# Patient Record
Sex: Male | Born: 1940 | Race: White | Hispanic: No | Smoking: Former smoker
Health system: Southern US, Community
[De-identification: ages and names within clinical notes are randomized; demographics above are authoritative.]

## PROBLEM LIST (undated history)

## (undated) DIAGNOSIS — K5790 Diverticulosis of intestine, part unspecified, without perforation or abscess without bleeding: Secondary | ICD-10-CM

## (undated) DIAGNOSIS — K279 Peptic ulcer, site unspecified, unspecified as acute or chronic, without hemorrhage or perforation: Secondary | ICD-10-CM

## (undated) DIAGNOSIS — F32A Depression, unspecified: Secondary | ICD-10-CM

## (undated) DIAGNOSIS — F329 Major depressive disorder, single episode, unspecified: Secondary | ICD-10-CM

## (undated) DIAGNOSIS — I10 Essential (primary) hypertension: Secondary | ICD-10-CM

## (undated) HISTORY — DX: Peptic ulcer, site unspecified, unspecified as acute or chronic, without hemorrhage or perforation: K27.9

## (undated) HISTORY — DX: Depression, unspecified: F32.A

## (undated) HISTORY — DX: Major depressive disorder, single episode, unspecified: F32.9

## (undated) HISTORY — DX: Diverticulosis of intestine, part unspecified, without perforation or abscess without bleeding: K57.90

## (undated) HISTORY — PX: CATARACT EXTRACTION W/ INTRAOCULAR LENS IMPLANT: SHX1309

## (undated) HISTORY — DX: Essential (primary) hypertension: I10

---
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
## 2004-08-24 HISTORY — PX: CARDIAC CATHETERIZATION: SHX172

## 2004-08-24 HISTORY — PX: CORONARY ANGIOPLASTY WITH STENT PLACEMENT: SHX49

## 2004-10-12 ENCOUNTER — Other Ambulatory Visit: Payer: Self-pay

## 2004-10-12 ENCOUNTER — Emergency Department: Payer: Self-pay | Admitting: Emergency Medicine

## 2004-11-06 ENCOUNTER — Observation Stay: Payer: Self-pay | Admitting: Internal Medicine

## 2005-03-08 IMAGING — CT CT ABD-PELV W/ CM
1 of 3 series · 14 of 32 positions shown, 18 images · non-contrast
Comparison: none

REASON FOR EXAM: (1) abd pain  IV & ORAL CONTRAST; (2) abd pain
COMMENTS:

[Series 3: inspace · axial · 0.80mm/px · z∈[-1556,-1149]mm · 14 of 458 slices shown, 18 images]
[im 34/458  soft-tissue]
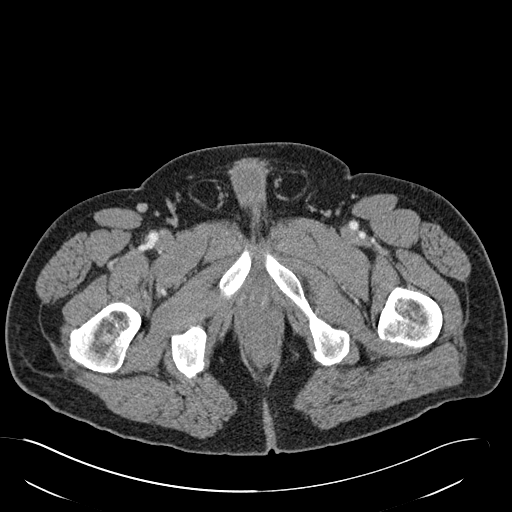
[im 34/458  bone]
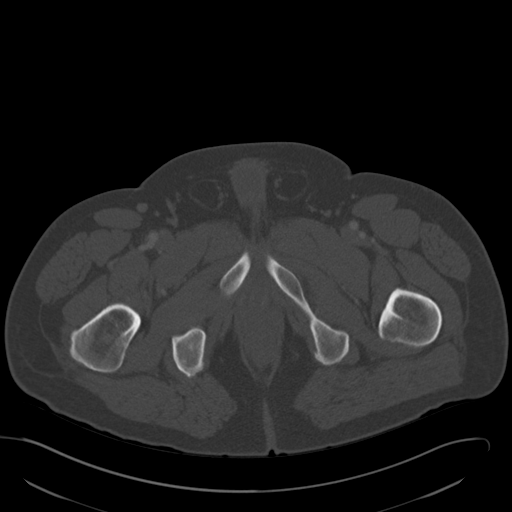
[im 68/458  soft-tissue]
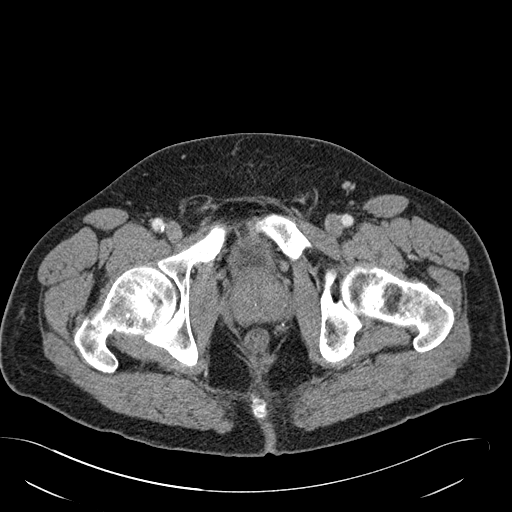
[im 102/458  soft-tissue]
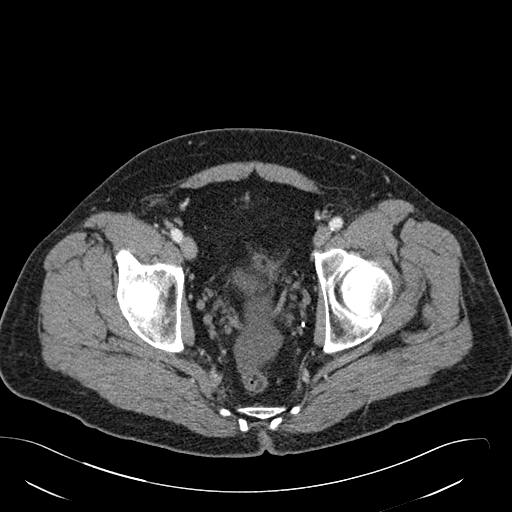
[im 136/458  soft-tissue]
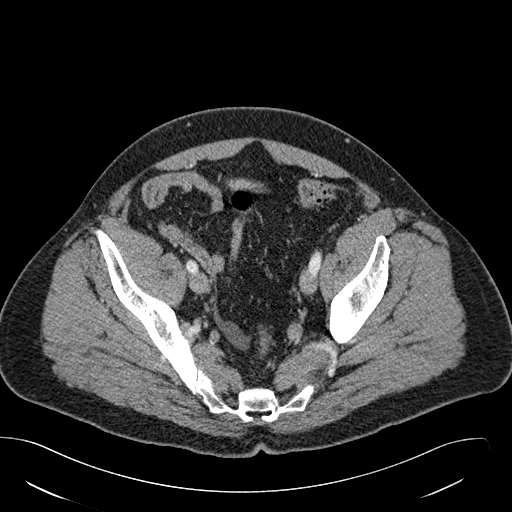
[im 170/458  soft-tissue]
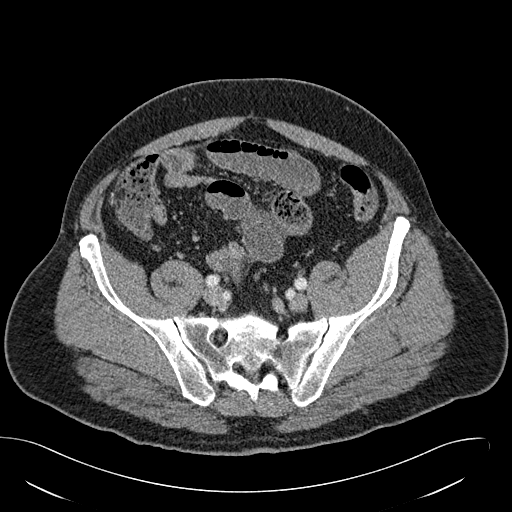
[im 204/458  soft-tissue]
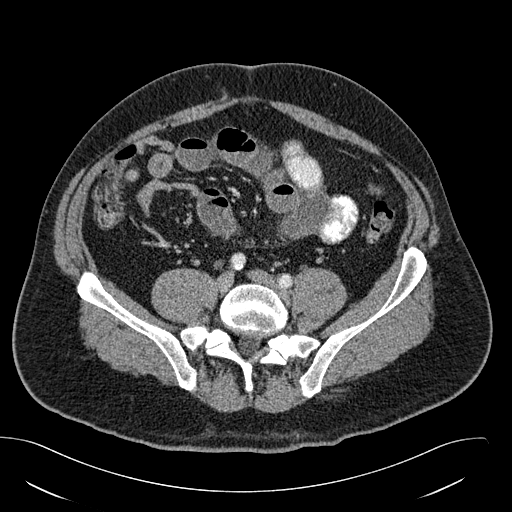
[im 254/458  soft-tissue]
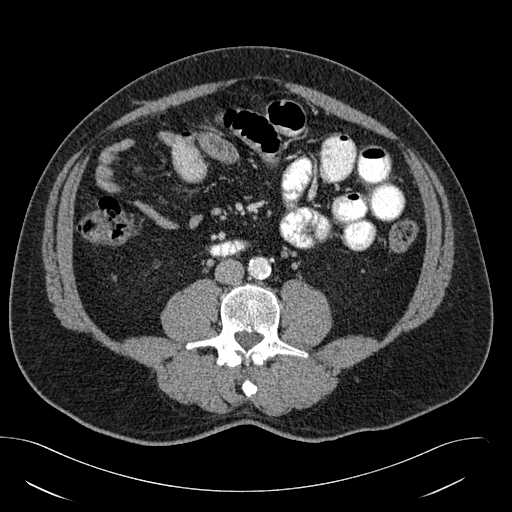
[im 288/458  soft-tissue]
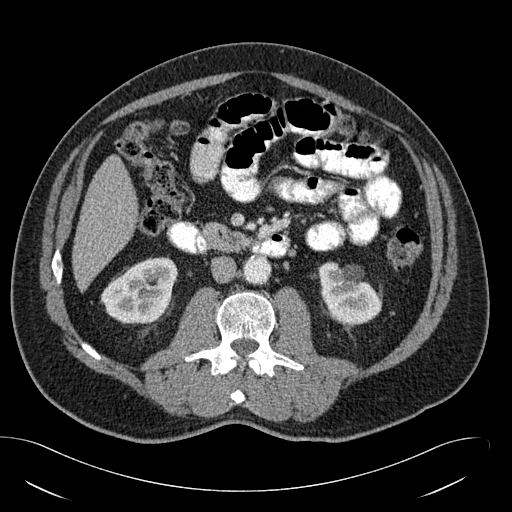
[im 322/458  soft-tissue]
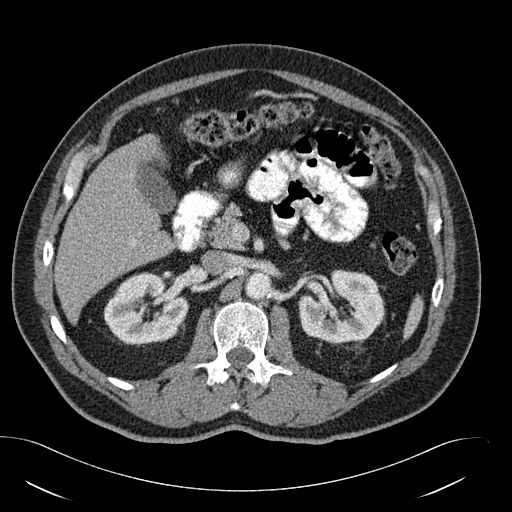
[im 322/458  bone]
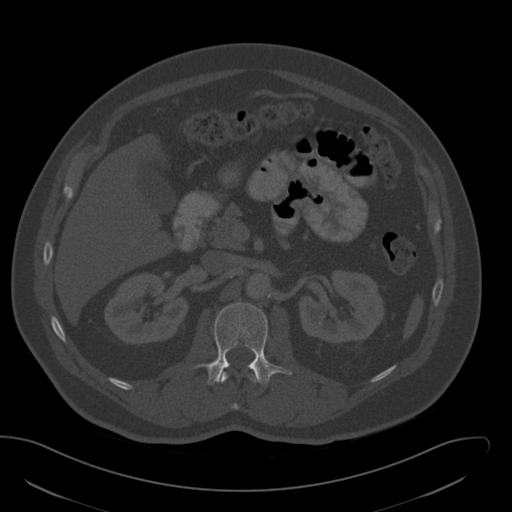
[im 356/458  soft-tissue]
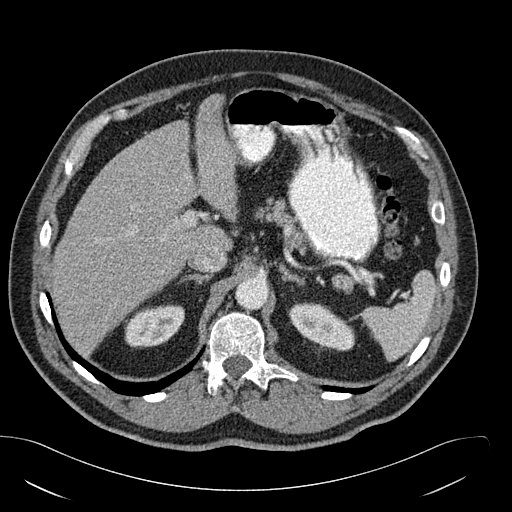
[im 390/458  soft-tissue]
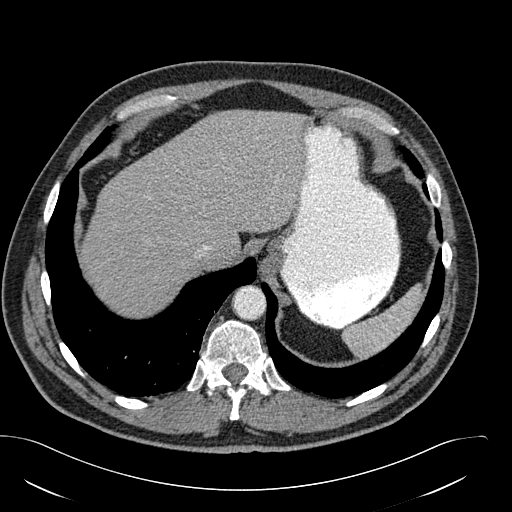
[im 390/458  lung]
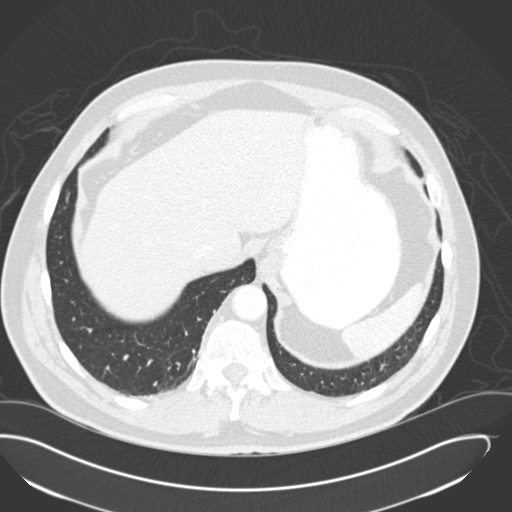
[im 407/458  lung]
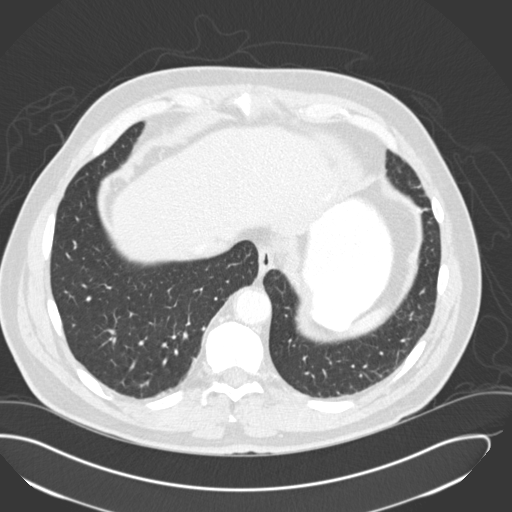
[im 424/458  soft-tissue]
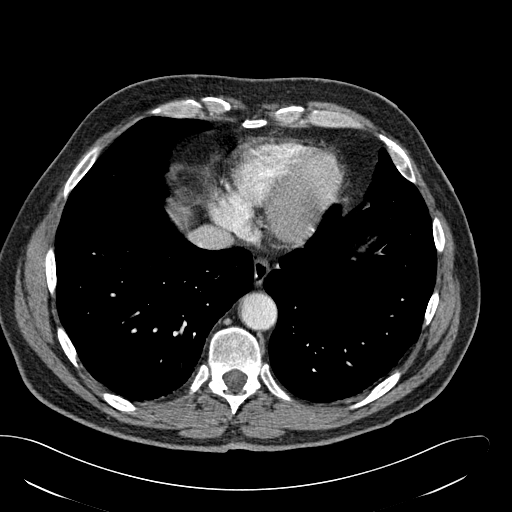
[im 424/458  lung]
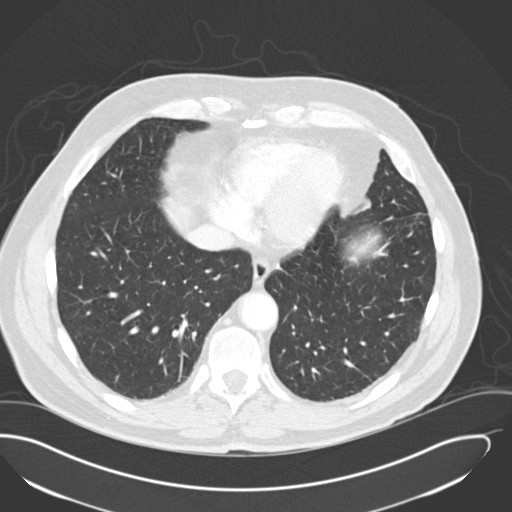
[im 441/458  lung]
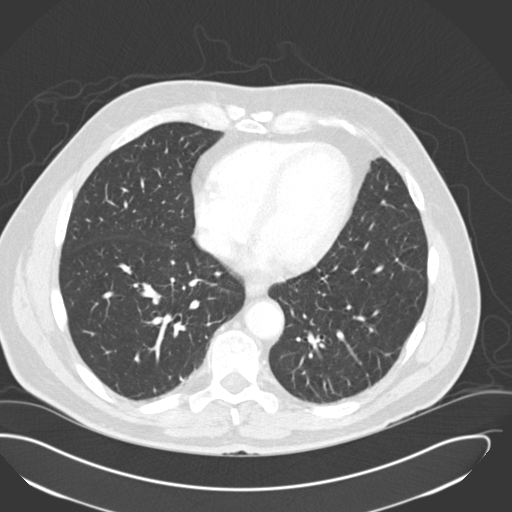

[14 of 32 positions shown; findings below may reference images not displayed]

PROCEDURE:     CT  - CT ABDOMEN / PELVIS  W  - October 12, 2004  [DATE]

RESULT:     The patient is complaining of abdominal discomfort.  The patient
received 100 cc of Isovue 370 as well as oral contrast.

The liver exhibits normal density with no evidence of a mass nor of ductal
dilation.  The gallbladder is adequately distended with no evidence of stone
or wall thickening nor pericholecystic fluid.  The spleen, pancreas and
adrenal glands are normal in appearance.  The stomach is moderately
distended with contrast material and other fluid.  The periaortic and
pericaval regions exhibit no evidence of lymphadenopathy.  There is mild
failure to taper of the abdominal aorta but there is no evidence of an
aneurysm.  The kidneys enhance well.  There is a hypodensity associated with
the lower pole of the LEFT kidney.  Hounsfield measurement is 1 and this is
most compatible with a benign cyst.  It measures approximately 1.2 x 1.7 cm
in diameter.

The loops of partially contrast-filled small bowel are grossly normal.
However, contrast material has not reached the distal small bowel nor the
colon.  The transverse colon contains stool and gas and is grossly normal.
There is a dense calcification in the upper aspect of the pelvis that
appears to be associated with the cecum or appendix.  I do not see
significant surrounding inflammation, however.  This could reflect an
appendicolith though certainly a very dense medication tablet could produce
similar findings.  The descending colon and sigmoid colon exhibit no
evidence of acute inflammation.  A few scattered diverticula are seen.  The
urinary bladder is partially distended and grossly normal.  The prostate
gland produces a moderate impression upon the urinary bladder base.  There
is a small amount of free fluid in the pelvis.  I see no pelvic
lymphadenopathy.

The lung bases are clear.
IMPRESSION: 1.     I do not see objective evidence of an intra-abdominal abscess nor
evidence of perforation or obstruction.  However, there is a small amount of
free fluid in the pelvis and there is a radiodensity in the RIGHT lower
quadrant of the abdomen that is suggestive of a fecalith or conceivably an
appendicolith at the junction of the appendix with the RIGHT colon.  I do
not see significant surrounding edema nor evidence of free fluid in this
region.  Correlation with the patient's clinical and laboratory exam with
attention to this area of the abdomen will be needed.
2.     The appearance of the loops of small bowel is grossly normal though
the contrast has not yet reached the distal small bowel.  Very minimal
prominence of the caliber of the small bowel loops is seen but the
appearance is not consistent with an obstruction.
3.     I see no evidence of hepatobiliary disease nor acute renal
abnormality.
4.     The findings were called to the Emergency Room at the conclusion of
the study.

## 2005-04-02 IMAGING — CT CT HEAD WITHOUT CONTRAST
2 series · 16 of 30 positions shown, 20 images · non-contrast
Comparison: none

REASON FOR EXAM: syncope    rm 18
COMMENTS:

[Series 2: without · axial · non-contrast · 0.41mm/px · z∈[-142,-22]mm · 13 of 30 slices shown, 17 images]
[im 3/30  brain]
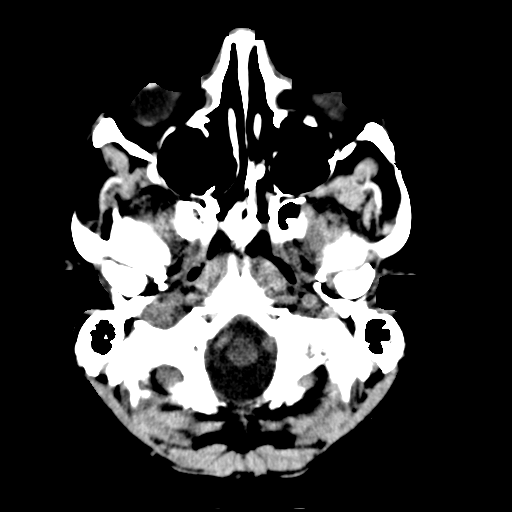
[im 3/30  bone]
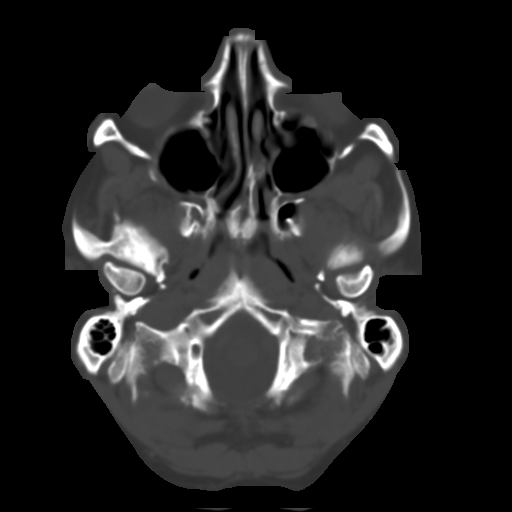
[im 5/30  brain]
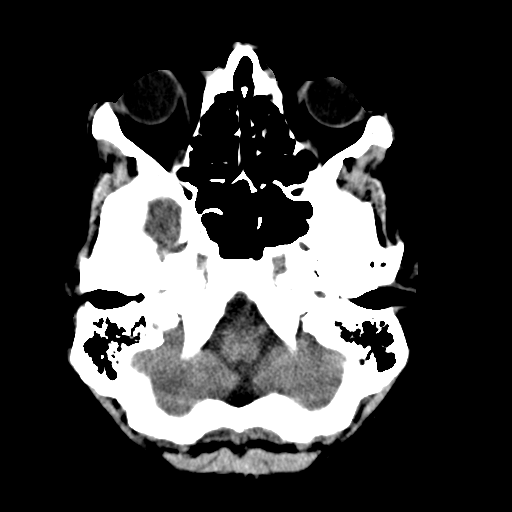
[im 7/30  brain]
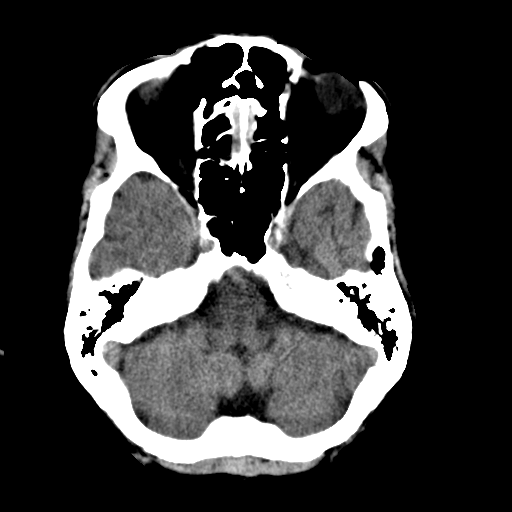
[im 9/30  brain]
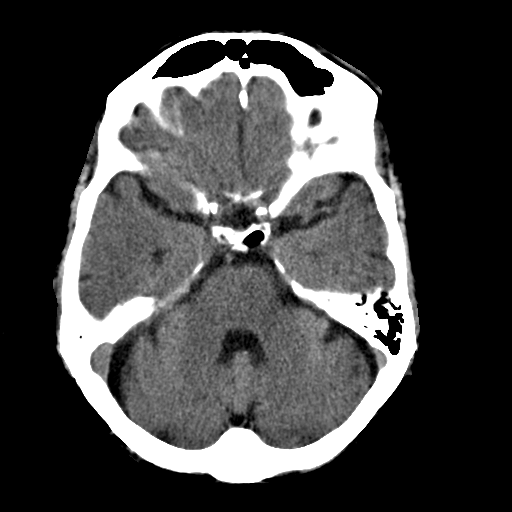
[im 11/30  brain]
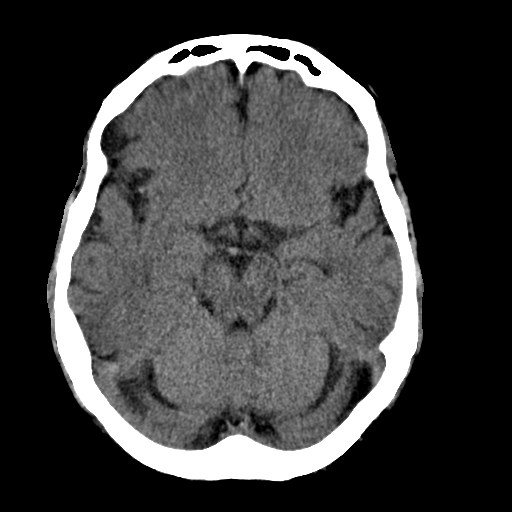
[im 11/30  bone]
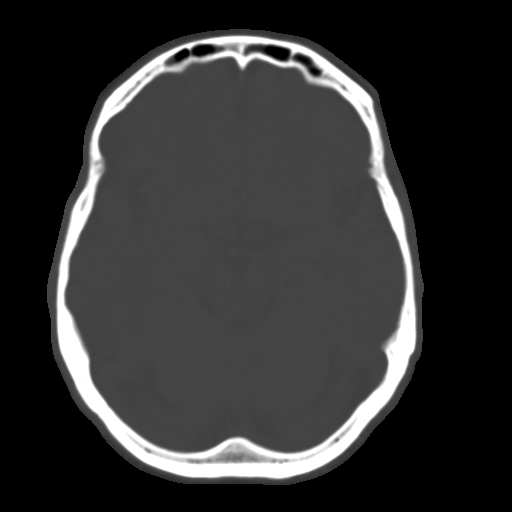
[im 13/30  brain]
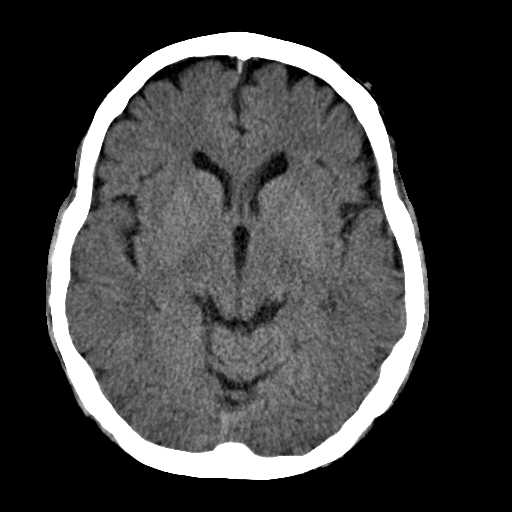
[im 15/30  brain]
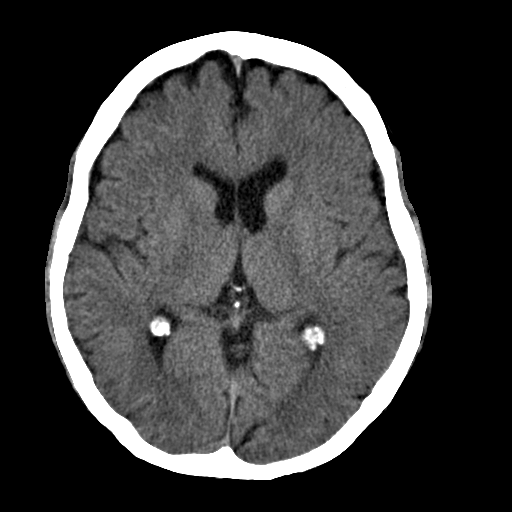
[im 17/30  brain]
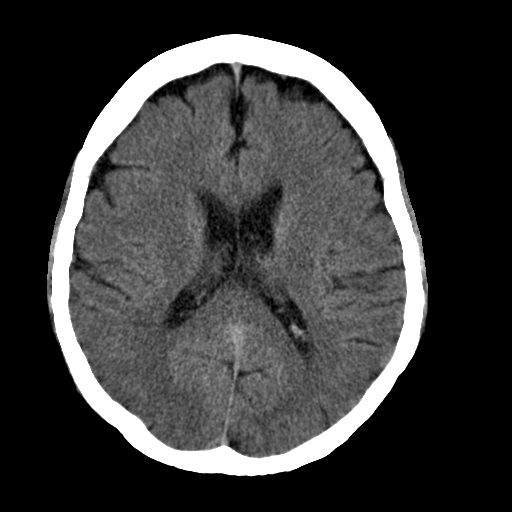
[im 19/30  brain]
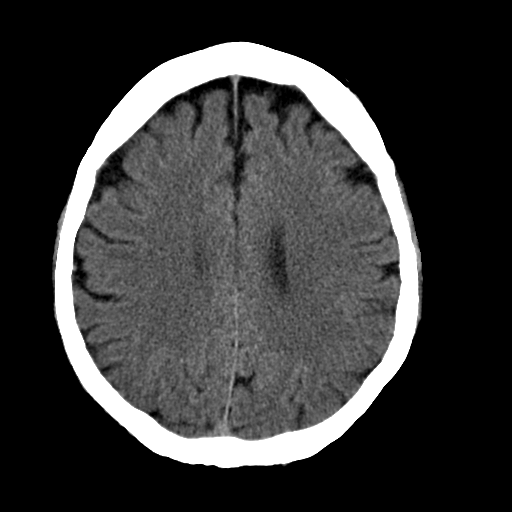
[im 19/30  bone]
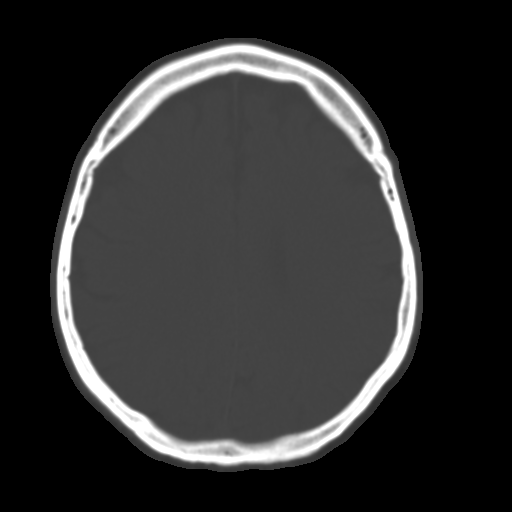
[im 21/30  brain]
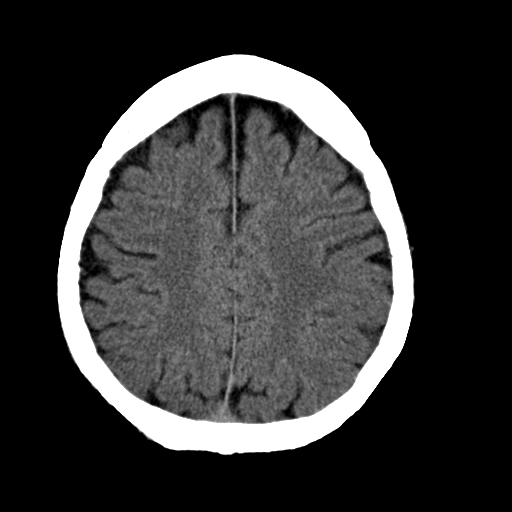
[im 23/30  brain]
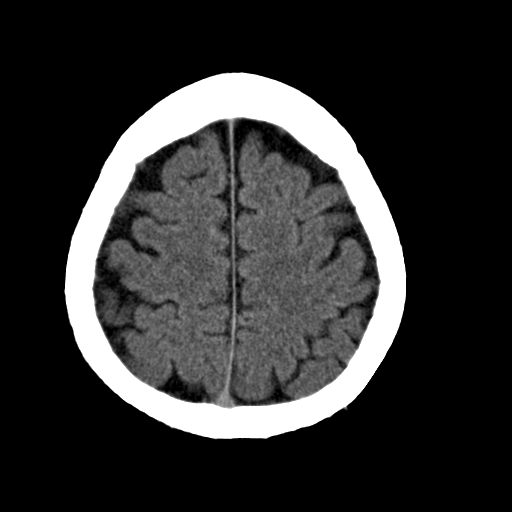
[im 25/30  brain]
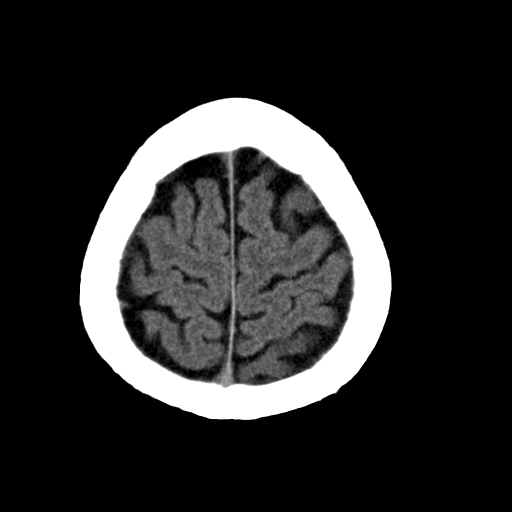
[im 27/30  brain]
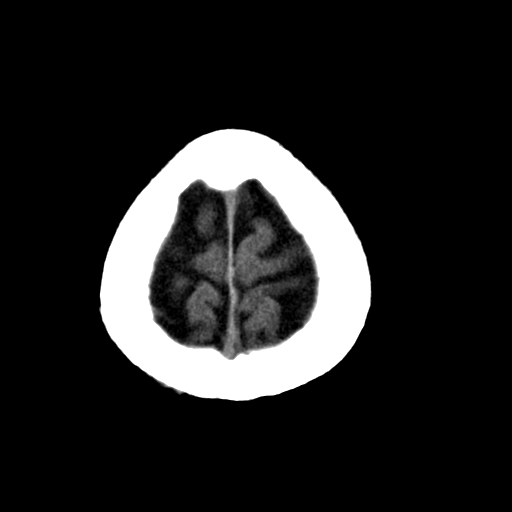
[im 27/30  bone]
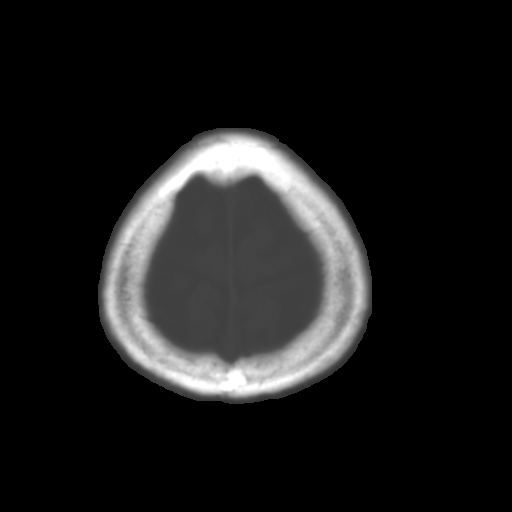

[Series 3: bone windows · axial · 0.41mm/px · z∈[-142,-102]mm · 3 of 30 slices shown]
[im 3/30  bone]
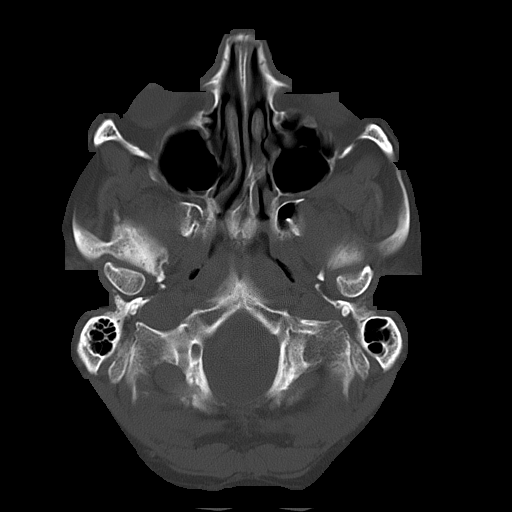
[im 7/30  bone]
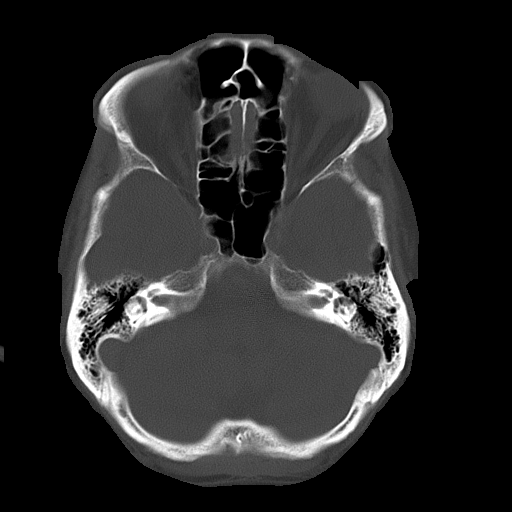
[im 11/30  bone]
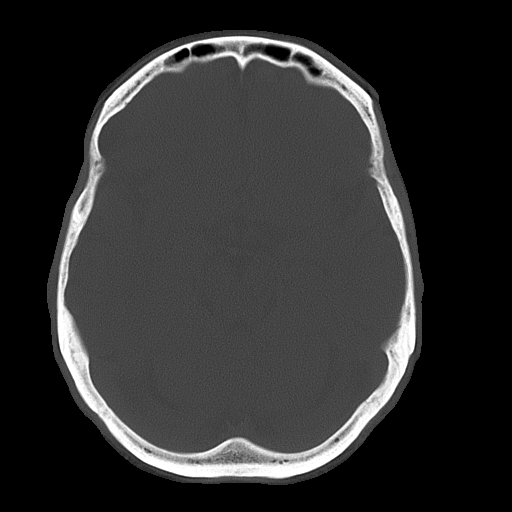

[16 of 30 positions shown; findings below may reference images not displayed]

PROCEDURE:     CT  - CT HEAD WITHOUT CONTRAST  - November 06, 2004  [DATE]

RESULT:     Emergent unenhanced Head CT was performed for syncope. The exam
was originally read by [HOSPITAL] as negative for atrophy.

There are noted mild age appropriate atrophy. No mass effect is seen. No
shift to the midline. No intracerebral bleeds are noted. No extra-axial
fluid collections are identified.  On the bone window settings no definite
acute fractures are seen.
IMPRESSION: 1)Mild age appropriate atrophy, otherwise, no significant abnormalities are
identified.

## 2009-10-16 ENCOUNTER — Ambulatory Visit: Payer: Self-pay | Admitting: Ophthalmology

## 2009-10-28 ENCOUNTER — Ambulatory Visit: Payer: Self-pay | Admitting: Ophthalmology

## 2012-05-25 DIAGNOSIS — H2510 Age-related nuclear cataract, unspecified eye: Secondary | ICD-10-CM | POA: Diagnosis not present

## 2012-07-21 DIAGNOSIS — N400 Enlarged prostate without lower urinary tract symptoms: Secondary | ICD-10-CM | POA: Diagnosis not present

## 2012-07-21 DIAGNOSIS — N433 Hydrocele, unspecified: Secondary | ICD-10-CM | POA: Diagnosis not present

## 2012-07-21 DIAGNOSIS — Z886 Allergy status to analgesic agent status: Secondary | ICD-10-CM | POA: Diagnosis not present

## 2012-07-21 DIAGNOSIS — K56609 Unspecified intestinal obstruction, unspecified as to partial versus complete obstruction: Secondary | ICD-10-CM | POA: Diagnosis not present

## 2012-07-21 DIAGNOSIS — K5730 Diverticulosis of large intestine without perforation or abscess without bleeding: Secondary | ICD-10-CM | POA: Diagnosis not present

## 2012-07-21 DIAGNOSIS — R110 Nausea: Secondary | ICD-10-CM | POA: Diagnosis not present

## 2012-07-21 DIAGNOSIS — Z9861 Coronary angioplasty status: Secondary | ICD-10-CM | POA: Diagnosis not present

## 2012-07-21 DIAGNOSIS — R1084 Generalized abdominal pain: Secondary | ICD-10-CM | POA: Diagnosis not present

## 2012-07-21 DIAGNOSIS — K5289 Other specified noninfective gastroenteritis and colitis: Secondary | ICD-10-CM | POA: Diagnosis not present

## 2012-07-21 DIAGNOSIS — R109 Unspecified abdominal pain: Secondary | ICD-10-CM | POA: Diagnosis not present

## 2012-08-24 HISTORY — PX: HYDROCELE EXCISION / REPAIR: SUR1145

## 2012-12-01 ENCOUNTER — Ambulatory Visit: Payer: Self-pay | Admitting: Unknown Physician Specialty

## 2012-12-01 LAB — HM COLONOSCOPY

## 2013-08-04 DIAGNOSIS — R1084 Generalized abdominal pain: Secondary | ICD-10-CM | POA: Diagnosis not present

## 2013-08-05 ENCOUNTER — Emergency Department: Payer: Self-pay | Admitting: Emergency Medicine

## 2013-08-05 LAB — COMPREHENSIVE METABOLIC PANEL
Alkaline Phosphatase: 52 U/L
Anion Gap: 5 — ABNORMAL LOW (ref 7–16)
Bilirubin,Total: 0.5 mg/dL (ref 0.2–1.0)
Calcium, Total: 9.1 mg/dL (ref 8.5–10.1)
Chloride: 105 mmol/L (ref 98–107)
Creatinine: 1.32 mg/dL — ABNORMAL HIGH (ref 0.60–1.30)
EGFR (African American): >60
EGFR (Non-African Amer.): 54 — ABNORMAL LOW
Glucose: 119 mg/dL — ABNORMAL HIGH (ref 65–99)
Osmolality: 280 (ref 275–301)
Potassium: 4.1 mmol/L (ref 3.5–5.1)
SGOT(AST): 31 U/L (ref 15–37)
Total Protein: 7.1 g/dL (ref 6.4–8.2)

## 2013-08-05 LAB — CBC WITH DIFFERENTIAL/PLATELET
Basophil #: 0.1 10*3/uL (ref 0.0–0.1)
Eosinophil #: 0.1 10*3/uL (ref 0.0–0.7)
HCT: 44.6 % (ref 40.0–52.0)
HGB: 14.5 g/dL (ref 13.0–18.0)
MCH: 29.2 pg (ref 26.0–34.0)
MCV: 90 fL (ref 80–100)
Monocyte #: 1.2 x10 3/mm — ABNORMAL HIGH (ref 0.2–1.0)
Monocyte %: 8.7 %
Neutrophil %: 68.2 %
RBC: 4.97 10*6/uL (ref 4.40–5.90)
WBC: 13.6 10*3/uL — ABNORMAL HIGH (ref 3.8–10.6)

## 2013-08-05 LAB — URINALYSIS, COMPLETE
Blood: NEGATIVE
Glucose,UR: NEGATIVE mg/dL (ref 0–75)
Nitrite: NEGATIVE
Squamous Epithelial: NONE SEEN
WBC UR: 3 /HPF (ref 0–5)

## 2013-08-05 LAB — LIPASE, BLOOD: Lipase: 65 U/L — ABNORMAL LOW (ref 73–393)

## 2013-08-29 DIAGNOSIS — K5730 Diverticulosis of large intestine without perforation or abscess without bleeding: Secondary | ICD-10-CM | POA: Diagnosis not present

## 2013-08-29 DIAGNOSIS — R198 Other specified symptoms and signs involving the digestive system and abdomen: Secondary | ICD-10-CM | POA: Diagnosis not present

## 2013-10-09 DIAGNOSIS — Z961 Presence of intraocular lens: Secondary | ICD-10-CM | POA: Diagnosis not present

## 2013-10-09 DIAGNOSIS — H2510 Age-related nuclear cataract, unspecified eye: Secondary | ICD-10-CM | POA: Diagnosis not present

## 2013-10-09 DIAGNOSIS — H26499 Other secondary cataract, unspecified eye: Secondary | ICD-10-CM | POA: Diagnosis not present

## 2013-10-09 DIAGNOSIS — H04129 Dry eye syndrome of unspecified lacrimal gland: Secondary | ICD-10-CM | POA: Diagnosis not present

## 2014-10-17 DIAGNOSIS — R55 Syncope and collapse: Secondary | ICD-10-CM | POA: Diagnosis not present

## 2014-10-17 DIAGNOSIS — I10 Essential (primary) hypertension: Secondary | ICD-10-CM | POA: Diagnosis not present

## 2014-10-17 DIAGNOSIS — Z87891 Personal history of nicotine dependence: Secondary | ICD-10-CM | POA: Diagnosis not present

## 2015-01-03 DIAGNOSIS — L853 Xerosis cutis: Secondary | ICD-10-CM | POA: Diagnosis not present

## 2015-01-03 DIAGNOSIS — Z789 Other specified health status: Secondary | ICD-10-CM | POA: Diagnosis not present

## 2015-01-03 DIAGNOSIS — L570 Actinic keratosis: Secondary | ICD-10-CM | POA: Diagnosis not present

## 2015-01-03 DIAGNOSIS — L820 Inflamed seborrheic keratosis: Secondary | ICD-10-CM | POA: Diagnosis not present

## 2015-01-03 DIAGNOSIS — L905 Scar conditions and fibrosis of skin: Secondary | ICD-10-CM | POA: Diagnosis not present

## 2015-01-03 DIAGNOSIS — L538 Other specified erythematous conditions: Secondary | ICD-10-CM | POA: Diagnosis not present

## 2015-01-03 DIAGNOSIS — L02821 Furuncle of head [any part, except face]: Secondary | ICD-10-CM | POA: Diagnosis not present

## 2015-05-06 ENCOUNTER — Ambulatory Visit: Payer: Self-pay

## 2015-07-16 DIAGNOSIS — E7800 Pure hypercholesterolemia, unspecified: Secondary | ICD-10-CM | POA: Diagnosis not present

## 2015-07-16 DIAGNOSIS — H2511 Age-related nuclear cataract, right eye: Secondary | ICD-10-CM | POA: Diagnosis not present

## 2015-07-16 DIAGNOSIS — T8522XA Displacement of intraocular lens, initial encounter: Secondary | ICD-10-CM | POA: Diagnosis not present

## 2015-07-16 DIAGNOSIS — I10 Essential (primary) hypertension: Secondary | ICD-10-CM | POA: Diagnosis not present

## 2015-07-17 DIAGNOSIS — H2589 Other age-related cataract: Secondary | ICD-10-CM | POA: Diagnosis not present

## 2016-07-13 DIAGNOSIS — K5790 Diverticulosis of intestine, part unspecified, without perforation or abscess without bleeding: Secondary | ICD-10-CM | POA: Insufficient documentation

## 2016-07-13 DIAGNOSIS — F329 Major depressive disorder, single episode, unspecified: Secondary | ICD-10-CM | POA: Insufficient documentation

## 2016-07-13 DIAGNOSIS — R194 Change in bowel habit: Secondary | ICD-10-CM | POA: Insufficient documentation

## 2016-07-13 DIAGNOSIS — K279 Peptic ulcer, site unspecified, unspecified as acute or chronic, without hemorrhage or perforation: Secondary | ICD-10-CM | POA: Insufficient documentation

## 2016-07-13 DIAGNOSIS — N529 Male erectile dysfunction, unspecified: Secondary | ICD-10-CM | POA: Insufficient documentation

## 2016-07-13 DIAGNOSIS — I10 Essential (primary) hypertension: Secondary | ICD-10-CM | POA: Insufficient documentation

## 2016-07-13 DIAGNOSIS — F32A Depression, unspecified: Secondary | ICD-10-CM | POA: Insufficient documentation

## 2016-07-14 ENCOUNTER — Ambulatory Visit (INDEPENDENT_AMBULATORY_CARE_PROVIDER_SITE_OTHER): Payer: Medicare Other | Admitting: Family Medicine

## 2016-07-14 ENCOUNTER — Encounter: Payer: Self-pay | Admitting: Family Medicine

## 2016-07-14 VITALS — BP 92/60 | HR 73 | Temp 97.7°F | Resp 16 | Wt 226.0 lb

## 2016-07-14 DIAGNOSIS — N529 Male erectile dysfunction, unspecified: Secondary | ICD-10-CM

## 2016-07-14 DIAGNOSIS — K5790 Diverticulosis of intestine, part unspecified, without perforation or abscess without bleeding: Secondary | ICD-10-CM | POA: Diagnosis not present

## 2016-07-14 DIAGNOSIS — Z23 Encounter for immunization: Secondary | ICD-10-CM

## 2016-07-14 MED ORDER — TADALAFIL 20 MG PO TABS: 20.0000 mg | ORAL_TABLET | Freq: Every day | ORAL | 1 refills | Status: DC | PRN

## 2016-07-14 MED ORDER — CIPROFLOXACIN HCL 500 MG PO TABS: 500.0000 mg | ORAL_TABLET | Freq: Two times a day (BID) | ORAL | 0 refills | Status: AC

## 2016-07-14 MED ORDER — METRONIDAZOLE 500 MG PO TABS: 500.0000 mg | ORAL_TABLET | Freq: Two times a day (BID) | ORAL | 0 refills | Status: DC

## 2016-07-14 MED ORDER — HYDROCODONE-ACETAMINOPHEN 7.5-325 MG PO TABS: 1.0000 | ORAL_TABLET | Freq: Four times a day (QID) | ORAL | 0 refills | Status: DC | PRN

## 2016-07-14 NOTE — Progress Notes (Signed)
Patient: Patrick AbleJohn W Nie Male    DOB: 22-Jan-1941   75 y.o.   MRN: [id number] Visit Date: 07/14/2016  Today's Provider: Mila Merryonald Raha Tennison, MD   Chief Complaint  Patient presents with  . Follow-up    ED   Subjective:    HPI Follow up Erectile Dysfunction: He had previously been followed by Dr. Garner Nashaniels and tried MUSE device which did not work well. He did not tolerate Viagra due to headaches and visual disturbances. He did fairly well with 10mg  Cialis, but states it is no longer as effective as it used to be.   He also history of recurrent diverticulitis and has responded well to metronidazole, cipro and hydrocodone/apap in the past. He usually keeps a prescription of these on hand, but has now expired and would like to get new prescriptions.   He has history of hypertension and hyperlipidemia which is managed at the [facility].     Allergies  Allergen Reactions  . Advil [Ibuprofen]     Gel Caps     Current Outpatient Prescriptions:  .  aspirin 81 MG tablet, Take 1 tablet by mouth daily., Disp: , Rfl:  .  atorvastatin (LIPITOR) 40 MG tablet, Take 1 tablet by mouth daily., Disp: , Rfl:  .  lisinopril (PRINIVIL,ZESTRIL) 40 MG tablet, Take 0.5 tablets by mouth daily., Disp: , Rfl:  .  tadalafil (CIALIS) 5 MG tablet, Take 1 tablet by mouth daily as needed., Disp: , Rfl:   Review of Systems  Constitutional: Negative for appetite change, chills and fever.  Respiratory: Negative for chest tightness, shortness of breath and wheezing.   Cardiovascular: Negative for chest pain and palpitations.  Gastrointestinal: Negative for abdominal pain, nausea and vomiting.    Social History  Substance Use Topics  . Smoking status: Former Games developermoker  . Smokeless tobacco: Never Used  . Alcohol use No   Objective:   BP 92/60 (BP Location: Left Arm, Patient Position: Sitting, Cuff Size: Large)   Pulse 73   Temp 97.7 F (36.5 C) (Oral)   Resp 16   Wt 226 lb (102.5 kg)   SpO2 97% Comment: room  air  Physical Exam  General appearance: alert, well developed, well nourished, cooperative and in no distress Head: Normocephalic, without obvious abnormality, atraumatic Respiratory: Respirations even and unlabored, normal respiratory rate Extremities: No gross deformities Skin: Skin color, texture, turgor normal. No rashes seen  Psych: Appropriate mood and affect. Neurologic: Mental status: Alert, oriented to person, place, and time, thought content appropriate.     Assessment & Plan:     1. Erectile dysfunction, unspecified erectile dysfunction type Try higher dose of cialis - tadalafil (CIALIS) 20 MG tablet; Take 1 tablet (20 mg total) by mouth daily as needed.  Dispense: 5 tablet; Refill: 1  2. Need for influenza vaccination  - Flu vaccine HIGH DOSE PF (Fluzone High dose)  3. 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  Dispense: 20 tablet; Refill: 0 - metroNIDAZOLE (FLAGYL) 500 MG tablet; Take 1 tablet (500 mg total) by mouth 2 (two) times daily.  Dispense: 20 tablet; Refill: 0 - HYDROcodone-acetaminophen (NORCO) 7.5-325 MG tablet; Take 1 tablet by mouth every 6 (six) hours as needed for moderate pain.  Dispense: 30 tablet; Refill: 0       Mila Merryonald Archana Eckman,  MD  [place]

## 2016-09-17 DIAGNOSIS — T8522XS Displacement of intraocular lens, sequela: Secondary | ICD-10-CM | POA: Diagnosis not present

## 2016-09-17 DIAGNOSIS — H268 Other specified cataract: Secondary | ICD-10-CM | POA: Diagnosis not present

## 2016-11-12 ENCOUNTER — Telehealth: Payer: Self-pay | Admitting: Family Medicine

## 2016-11-12 NOTE — Telephone Encounter (Signed)
Called Pt to schedule AWV with [facility] - knb °

## 2016-11-24 ENCOUNTER — Ambulatory Visit: Payer: Medicare Other

## 2016-12-01 ENCOUNTER — Ambulatory Visit (INDEPENDENT_AMBULATORY_CARE_PROVIDER_SITE_OTHER): Payer: Medicare Other

## 2016-12-01 VITALS — BP 108/70 | HR 68 | Temp 99.2°F | Ht 72.0 in | Wt 209.8 lb

## 2016-12-01 DIAGNOSIS — Z0000 Encounter for general adult medical examination without abnormal findings: Secondary | ICD-10-CM

## 2016-12-01 NOTE — Progress Notes (Signed)
Subjective:   Patrick Hardin is a 76 y.o. male who presents for an Initial Medicare Annual Wellness Visit.  Review of Systems  N/A  Cardiac Risk Factors include: advanced age (>69men, >48 women);dyslipidemia;hypertension;male gender    Objective:    Today's Vitals   12/01/16 1336  BP: 108/70  Pulse: 68  Temp: 99.2 F (37.3 C)  TempSrc: Oral  Weight: 209 lb 12.8 oz (95.2 kg)  Height: 6' (1.829 m)  PainSc: 0-No pain   Body mass index is 28.45 kg/m.  Current Medications (verified) Outpatient Encounter Prescriptions as of 12/01/2016  Medication Sig  . aspirin 81 MG tablet Take 1 tablet by mouth daily.  Marland Kitchen atorvastatin (LIPITOR) 40 MG tablet Take 1 tablet by mouth daily.  Marland Kitchen HYDROcodone-acetaminophen (NORCO) 7.5-325 MG tablet Take 1 tablet by mouth every 6 (six) hours as needed for moderate pain.  Marland Kitchen lisinopril (PRINIVIL,ZESTRIL) 40 MG tablet Take 0.5 tablets by mouth daily.  . tadalafil (CIALIS) 20 MG tablet Take 1 tablet (20 mg total) by mouth daily as needed.  . metroNIDAZOLE (FLAGYL) 500 MG tablet Take 1 tablet (500 mg total) by mouth 2 (two) times daily. (Patient not taking: Reported on 12/01/2016)  . Multiple Vitamin (MULTI-VITAMINS) TABS Take by mouth.   No facility-administered encounter medications on file as of 12/01/2016.     Allergies (verified) Advil [ibuprofen] and Viagra [sildenafil citrate]   History: Past Medical History:  Diagnosis Date  . Depression   . Diverticulosis   . Hypertension   . PUD (peptic ulcer disease)    Past Surgical History:  Procedure Laterality Date  . CARDIAC CATHETERIZATION  2006   Stenting at [facility]  . CATARACT EXTRACTION W/ INTRAOCULAR LENS IMPLANT    . CORONARY ANGIOPLASTY WITH STENT PLACEMENT  2006  . HYDROCELE EXCISION / REPAIR  2014   repair. [facility]  . TONSILLECTOMY AND ADENOIDECTOMY  1950   Family History  Problem Relation Age of Onset  . Leukemia Father    Social History   Occupational History  . Not on file.    Social History Main Topics  . Smoking status: Former Smoker    Types: Cigarettes  . Smokeless tobacco: Never Used     Comment: quit in 1997  . Alcohol use No  . Drug use: No  . Sexual activity: Not on file   Tobacco Counseling Counseling given: Not Answered   Activities of Daily Living In your present state of health, do you have any difficulty performing the following activities: 12/01/2016 07/14/2016  Hearing? N N  Vision? N N  Difficulty concentrating or making decisions? N N  Walking or climbing stairs? N N  Dressing or bathing? N N  Doing errands, shopping? N N  Preparing Food and eating ? N -  Using the Toilet? N -  In the past six months, have you accidently leaked urine? N -  Do you have problems with loss of bowel control? N -  Managing your Medications? N -  Managing your Finances? N -  Housekeeping or managing your Housekeeping? N -  Some recent data might be hidden    Immunizations and Health Maintenance Immunization History  Administered Date(s) Administered  . Influenza, High Dose Seasonal PF 07/14/2016   There are no preventive care reminders to display for this patient.  Patient Care Team: Malva Limes, MD as PCP - General (Family Medicine) Synetta Fail, MD as Referring Physician (Ophthalmology) Provider Not In System as Consulting Physician  Indicate any recent  Medical Services you may have received from other than [facility] providers in the past year (date may be approximate).    Assessment:   This is a routine wellness examination for Patrick Hardin.   Hearing/Vision screen Vision Screening Comments: Pt has vision checks once yearly.   Dietary issues and exercise activities discussed: Current Exercise Habits: Structured exercise class, Type of exercise: walking;treadmill;stretching;strength training/weights, Time (Minutes): 45, Frequency (Times/Week): 2 (to 3 days), Weekly Exercise (Minutes/Week): 90, Intensity: Mild  Goals    . Increase water  intake          Recommend increasing water intake to at least 4 glasses a day.      Depression Screen PHQ 2/9 Scores 12/01/2016 07/14/2016  PHQ - 2 Score 2 0  PHQ- 9 Score 8 0    Fall Risk Fall Risk  12/01/2016 07/14/2016  Falls in the past year? No No    Cognitive Function:     6CIT Screen 12/01/2016  What Year? 0 points  What month? 0 points  What time? 0 points  Count back from 20 0 points  Months in reverse 2 points  Repeat phrase 0 points  Total Score 2    Screening Tests Health Maintenance  Topic Date Due  . TETANUS/TDAP  08/24/2026 (Originally 01/08/1960)  . PNA vac Low Risk Adult (1 of 2 - PCV13) 08/24/2026 (Originally 01/09/2007)  . INFLUENZA VACCINE  03/24/2017  . COLONOSCOPY  12/02/2022        Plan:  I have personally reviewed and addressed the Medicare Annual Wellness questionnaire and have noted the following in the patient's chart:  A. Medical and social history B. Use of alcohol, tobacco or illicit drugs  C. Current medications and supplements D. Functional ability and status E.  Nutritional status F.  Physical activity G. Advance directives H. List of other physicians I.  Hospitalizations, surgeries, and ER visits in previous 12 months J.  Vitals K. Screenings such as hearing and vision if needed, cognitive and depression L. Referrals and appointments - none  In addition, I have reviewed and discussed with patient certain preventive protocols, quality metrics, and best practice recommendations. A written personalized care plan for preventive services as well as general preventive health recommendations were provided to patient.  See attached scanned questionnaire for additional information.   Signed,  Hyacinth Meeker, LPN Nurse Health Advisor   MD Recommendations: None. Pt declined tetanus and pneumonia vaccines today. Pt states he has had both pneumonia shots at the [place]. Requested copy.   I have reviewed the health advisor's note, was  available for consultation, and agree with documentation and plan  Mila Merry, MD

## 2016-12-01 NOTE — Patient Instructions (Signed)
Patrick Hardin , Thank you for taking time to come for your Medicare Wellness Visit. I appreciate your ongoing commitment to your health goals. Please review the following plan we discussed and let me know if I can assist you in the future.   Screening recommendations/referrals: Colonoscopy: last done 12/01/12 Recommended yearly ophthalmology/optometry visit for glaucoma screening and checkup Recommended yearly dental visit for hygiene and checkup  Vaccinations: Influenza vaccine: done 07/14/16 Pneumococcal vaccine: completed series at [facility] (per patient) Tdap vaccine: declined Shingles vaccine: completed (per patient)    Advanced directives: requested copy  Next appointment: None  Preventive Care 65 Years and Older, Male Preventive care refers to lifestyle choices and visits with your health care provider that can promote health and wellness. What does preventive care include?  A yearly physical exam. This is also called an annual well check.  Dental exams once or twice a year.  Routine eye exams. Ask your health care provider how often you should have your eyes checked.  Personal lifestyle choices, including:  Daily care of your teeth and gums.  Regular physical activity.  Eating a healthy diet.  Avoiding tobacco and drug use.  Limiting alcohol use.  Practicing safe sex.  Taking low doses of aspirin every day.  Taking vitamin and mineral supplements as recommended by your health care provider. What happens during an annual well check? The services and screenings done by your health care provider during your annual well check will depend on your age, overall health, lifestyle risk factors, and family history of disease. Counseling  Your health care provider may ask you questions about your:  Alcohol use.  Tobacco use.  Drug use.  Emotional well-being.  Home and relationship well-being.  Sexual activity.  Eating habits.  History of falls.  Memory and ability  to understand (cognition).  Work and work Astronomer. Screening  You may have the following tests or measurements:  Height, weight, and BMI.  Blood pressure.  Lipid and cholesterol levels. These may be checked every 5 years, or more frequently if you are over 13 years old.  Skin check.  Lung cancer screening. You may have this screening every year starting at age 43 if you have a 30-pack-year history of smoking and currently smoke or have quit within the past 15 years.  Fecal occult blood test (FOBT) of the stool. You may have this test every year starting at age 21.  Flexible sigmoidoscopy or colonoscopy. You may have a sigmoidoscopy every 5 years or a colonoscopy every 10 years starting at age 24.  Prostate cancer screening. Recommendations will vary depending on your family history and other risks.  Hepatitis C blood test.  Hepatitis B blood test.  Sexually transmitted disease (STD) testing.  Diabetes screening. This is done by checking your blood sugar (glucose) after you have not eaten for a while (fasting). You may have this done every 1-3 years.  Abdominal aortic aneurysm (AAA) screening. You may need this if you are a current or former smoker.  Osteoporosis. You may be screened starting at age 35 if you are at high risk. Talk with your health care provider about your test results, treatment options, and if necessary, the need for more tests. Vaccines  Your health care provider may recommend certain vaccines, such as:  Influenza vaccine. This is recommended every year.  Tetanus, diphtheria, and acellular pertussis (Tdap, Td) vaccine. You may need a Td booster every 10 years.  Zoster vaccine. You may need this after age 42.  Pneumococcal 13-valent conjugate (PCV13) vaccine. One dose is recommended after age 34.  Pneumococcal polysaccharide (PPSV23) vaccine. One dose is recommended after age 68. Talk to your health care provider about which screenings and vaccines  you need and how often you need them. This information is not intended to replace advice given to you by your health care provider. Make sure you discuss any questions you have with your health care provider. Document Released: 09/06/2015 Document Revised: 04/29/2016 Document Reviewed: 06/11/2015 Elsevier Interactive Patient Education  2017 [employer] Prevention in the Home Falls can cause injuries. They can happen to people of all ages. There are many things you can do to make your home safe and to help prevent falls. What can I do on the outside of my home?  Regularly fix the edges of walkways and driveways and fix any cracks.  Remove anything that might make you trip as you walk through a door, such as a raised step or threshold.  Trim any bushes or trees on the path to your home.  Use bright outdoor lighting.  Clear any walking paths of anything that might make someone trip, such as rocks or tools.  Regularly check to see if handrails are loose or broken. Make sure that both sides of any steps have handrails.  Any raised decks and porches should have guardrails on the edges.  Have any leaves, snow, or ice cleared regularly.  Use sand or salt on walking paths during winter.  Clean up any spills in your garage right away. This includes oil or grease spills. What can I do in the bathroom?  Use night lights.  Install grab bars by the toilet and in the tub and shower. Do not use towel bars as grab bars.  Use non-skid mats or decals in the tub or shower.  If you need to sit down in the shower, use a plastic, non-slip stool.  Keep the floor dry. Clean up any water that spills on the floor as soon as it happens.  Remove soap buildup in the tub or shower regularly.  Attach bath mats securely with double-sided non-slip rug tape.  Do not have throw rugs and other things on the floor that can make you trip. What can I do in the bedroom?  Use night lights.  Make sure  that you have a light by your bed that is easy to reach.  Do not use any sheets or blankets that are too big for your bed. They should not hang down onto the floor.  Have a firm chair that has side arms. You can use this for support while you get dressed.  Do not have throw rugs and other things on the floor that can make you trip. What can I do in the kitchen?  Clean up any spills right away.  Avoid walking on wet floors.  Keep items that you use a lot in easy-to-reach places.  If you need to reach something above you, use a strong step stool that has a grab bar.  Keep electrical cords out of the way.  Do not use floor polish or wax that makes floors slippery. If you must use wax, use non-skid floor wax.  Do not have throw rugs and other things on the floor that can make you trip. What can I do with my stairs?  Do not leave any items on the stairs.  Make sure that there are handrails on both sides of the stairs and use them.  Fix handrails that are broken or loose. Make sure that handrails are as long as the stairways.  Check any carpeting to make sure that it is firmly attached to the stairs. Fix any carpet that is loose or worn.  Avoid having throw rugs at the top or bottom of the stairs. If you do have throw rugs, attach them to the floor with carpet tape.  Make sure that you have a light switch at the top of the stairs and the bottom of the stairs. If you do not have them, ask someone to add them for you. What else can I do to help prevent falls?  Wear shoes that:  Do not have high heels.  Have rubber bottoms.  Are comfortable and fit you well.  Are closed at the toe. Do not wear sandals.  If you use a stepladder:  Make sure that it is fully opened. Do not climb a closed stepladder.  Make sure that both sides of the stepladder are locked into place.  Ask someone to hold it for you, if possible.  Clearly mark and make sure that you can see:  Any grab bars or  handrails.  First and last steps.  Where the edge of each step is.  Use tools that help you move around (mobility aids) if they are needed. These include:  Canes.  Walkers.  Scooters.  Crutches.  Turn on the lights when you go into a dark area. Replace any light bulbs as soon as they burn out.  Set up your furniture so you have a clear path. Avoid moving your furniture around.  If any of your floors are uneven, fix them.  If there are any pets around you, be aware of where they are.  Review your medicines with your doctor. Some medicines can make you feel dizzy. This can increase your chance of falling. Ask your doctor what other things that you can do to help prevent falls. This information is not intended to replace advice given to you by your health care provider. Make sure you discuss any questions you have with your health care provider. Document Released: 06/06/2009 Document Revised: 01/16/2016 Document Reviewed: 09/14/2014 Elsevier Interactive Patient Education  2017 [employer].

## 2016-12-24 DIAGNOSIS — T8522XS Displacement of intraocular lens, sequela: Secondary | ICD-10-CM | POA: Diagnosis not present

## 2016-12-24 DIAGNOSIS — H268 Other specified cataract: Secondary | ICD-10-CM | POA: Diagnosis not present

## 2016-12-24 DIAGNOSIS — Z961 Presence of intraocular lens: Secondary | ICD-10-CM | POA: Diagnosis not present

## 2017-01-19 DIAGNOSIS — H4312 Vitreous hemorrhage, left eye: Secondary | ICD-10-CM | POA: Diagnosis not present

## 2017-01-19 DIAGNOSIS — H268 Other specified cataract: Secondary | ICD-10-CM | POA: Diagnosis not present

## 2017-01-19 DIAGNOSIS — Z961 Presence of intraocular lens: Secondary | ICD-10-CM | POA: Diagnosis not present

## 2017-01-19 DIAGNOSIS — T8522XS Displacement of intraocular lens, sequela: Secondary | ICD-10-CM | POA: Diagnosis not present

## 2017-01-27 DIAGNOSIS — Z961 Presence of intraocular lens: Secondary | ICD-10-CM | POA: Diagnosis not present

## 2017-01-27 DIAGNOSIS — H2102 Hyphema, left eye: Secondary | ICD-10-CM | POA: Diagnosis not present

## 2017-01-27 DIAGNOSIS — H4312 Vitreous hemorrhage, left eye: Secondary | ICD-10-CM | POA: Diagnosis not present

## 2017-01-27 DIAGNOSIS — T8522XS Displacement of intraocular lens, sequela: Secondary | ICD-10-CM | POA: Diagnosis not present

## 2017-02-17 ENCOUNTER — Other Ambulatory Visit: Payer: Self-pay | Admitting: *Deleted

## 2017-02-17 DIAGNOSIS — K5790 Diverticulosis of intestine, part unspecified, without perforation or abscess without bleeding: Secondary | ICD-10-CM

## 2017-02-17 NOTE — Telephone Encounter (Signed)
Refill request for Hydrocodone-ace 7.5-325 mg  Last filled by MD on- 07/14/2016 #30 x0 Last Appt: 07/14/2016 Next Appt: none Please advise refill?

## 2017-02-18 MED ORDER — HYDROCODONE-ACETAMINOPHEN 7.5-325 MG PO TABS: 1.0000 | ORAL_TABLET | Freq: Four times a day (QID) | ORAL | 0 refills | Status: DC | PRN

## 2017-11-18 ENCOUNTER — Telehealth: Payer: Self-pay

## 2017-11-18 NOTE — Telephone Encounter (Signed)
LMTCB and schedule AWV.  -MM 

## 2017-11-22 ENCOUNTER — Telehealth: Payer: Self-pay | Admitting: Family Medicine

## 2017-11-22 NOTE — Telephone Encounter (Signed)
No answer left message for patient to call me back.

## 2018-02-07 ENCOUNTER — Telehealth: Payer: Self-pay | Admitting: Family Medicine

## 2018-02-07 NOTE — Telephone Encounter (Signed)
Pt states he would like to push his wellness off for a while.  States he has been going to the [facility] for care and does not want to schedule at this time.

## 2018-02-07 NOTE — Telephone Encounter (Signed)
Called to schedule Medicare Annual Wellness Visit with Nurse Health Advisor. If patient returns call, please note: their last AWV was on 4/10 /18 please schedule AWV with NHA any date °Thank you! °For any questions please contact: °Kathryn Brown °[phone]  °Skype [email or web address]  ° °

## 2018-02-10 NOTE — Telephone Encounter (Signed)
Updating appt desk and will let Ronne BinningMckenzie know too thanks!

## 2019-03-27 ENCOUNTER — Other Ambulatory Visit: Payer: Self-pay

## 2019-09-15 ENCOUNTER — Ambulatory Visit: Payer: Self-pay | Admitting: Family Medicine

## 2019-09-15 NOTE — Telephone Encounter (Signed)
Pt states he received notice he may schedule covid vaccine at [facility]. 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 Pt is asymptomatic and has been quarantining for 2-3 weeks. Advised to schedule vaccine. Advised to CB if any additional questions arise.  Reason for Disposition . Health Information question, no triage required and triager able to answer question  Answer Assessment - Initial Assessment Questions 1. REASON FOR CALL or QUESTION: "What is your reason for calling today?" or "How can I best help you?" or "What question do you have that I can help answer?"     Covid vaccine questions  Protocols used: INFORMATION ONLY CALL - NO TRIAGE-A-AH

## 2020-05-07 ENCOUNTER — Emergency Department: Payer: No Typology Code available for payment source

## 2020-05-07 ENCOUNTER — Emergency Department
Admission: EM | Admit: 2020-05-07 | Discharge: 2020-05-09 | Disposition: A | Discharge status: Home / Self Care | Payer: No Typology Code available for payment source | Attending: Student in an Organized Health Care Education/Training Program | Admitting: Student in an Organized Health Care Education/Training Program

## 2020-05-07 ENCOUNTER — Encounter: Payer: Self-pay | Admitting: Emergency Medicine

## 2020-05-07 ENCOUNTER — Other Ambulatory Visit: Payer: Self-pay

## 2020-05-07 DIAGNOSIS — R45851 Suicidal ideations: Secondary | ICD-10-CM

## 2020-05-07 DIAGNOSIS — Z79899 Other long term (current) drug therapy: Secondary | ICD-10-CM | POA: Diagnosis not present

## 2020-05-07 DIAGNOSIS — Z7982 Long term (current) use of aspirin: Secondary | ICD-10-CM | POA: Diagnosis not present

## 2020-05-07 DIAGNOSIS — I10 Essential (primary) hypertension: Secondary | ICD-10-CM | POA: Insufficient documentation

## 2020-05-07 DIAGNOSIS — Z87891 Personal history of nicotine dependence: Secondary | ICD-10-CM | POA: Diagnosis not present

## 2020-05-07 DIAGNOSIS — Z20822 Contact with and (suspected) exposure to covid-19: Secondary | ICD-10-CM | POA: Diagnosis not present

## 2020-05-07 DIAGNOSIS — F332 Major depressive disorder, recurrent severe without psychotic features: Secondary | ICD-10-CM | POA: Diagnosis not present

## 2020-05-07 DIAGNOSIS — R410 Disorientation, unspecified: Secondary | ICD-10-CM | POA: Diagnosis not present

## 2020-05-07 DIAGNOSIS — F329 Major depressive disorder, single episode, unspecified: Secondary | ICD-10-CM | POA: Diagnosis present

## 2020-05-07 LAB — COMPREHENSIVE METABOLIC PANEL
ALT: 27 U/L (ref 0–44)
AST: 34 U/L (ref 15–41)
Albumin: 4.2 g/dL (ref 3.5–5.0)
Alkaline Phosphatase: 53 U/L (ref 38–126)
Anion gap: 13 (ref 5–15)
BUN: 19 mg/dL (ref 8–23)
CO2: 20 mmol/L — ABNORMAL LOW (ref 22–32)
Calcium: 9.2 mg/dL (ref 8.9–10.3)
Chloride: 102 mmol/L (ref 98–111)
Creatinine, Ser: 1.18 mg/dL (ref 0.61–1.24)
GFR calc Af Amer: >60 mL/min (ref >60)
GFR calc non Af Amer: 59 mL/min — ABNORMAL LOW (ref >60)
Glucose, Bld: 94 mg/dL (ref 70–99)
Potassium: 3.9 mmol/L (ref 3.5–5.1)
Sodium: 135 mmol/L (ref 135–145)
Total Bilirubin: 2.5 mg/dL — ABNORMAL HIGH (ref 0.3–1.2)
Total Protein: 7.8 g/dL (ref 6.5–8.1)

## 2020-05-07 LAB — URINALYSIS, COMPLETE (UACMP) WITH MICROSCOPIC
Bilirubin Urine: NEGATIVE
Glucose, UA: NEGATIVE mg/dL
Hgb urine dipstick: NEGATIVE
Ketones, ur: 20 mg/dL — AB
Nitrite: NEGATIVE
Protein, ur: NEGATIVE mg/dL
Specific Gravity, Urine: 1.018 (ref 1.005–1.030)
pH: 5 (ref 5.0–8.0)

## 2020-05-07 LAB — CBC
HCT: 47 % (ref 39.0–52.0)
Hemoglobin: 16 g/dL (ref 13.0–17.0)
MCH: 30.3 pg (ref 26.0–34.0)
MCHC: 34 g/dL (ref 30.0–36.0)
MCV: 89 fL (ref 80.0–100.0)
Platelets: 262 10*3/uL (ref 150–400)
RBC: 5.28 MIL/uL (ref 4.22–5.81)
RDW: 13.3 % (ref 11.5–15.5)
WBC: 14.3 10*3/uL — ABNORMAL HIGH (ref 4.0–10.5)
nRBC: 0 % (ref 0.0–0.2)

## 2020-05-07 LAB — URINE DRUG SCREEN, QUALITATIVE (ARMC ONLY)
Amphetamines, Ur Screen: NOT DETECTED
Barbiturates, Ur Screen: NOT DETECTED
Benzodiazepine, Ur Scrn: NOT DETECTED
Cannabinoid 50 Ng, Ur ~~LOC~~: POSITIVE — AB
Cocaine Metabolite,Ur ~~LOC~~: NOT DETECTED
MDMA (Ecstasy)Ur Screen: NOT DETECTED
Methadone Scn, Ur: NOT DETECTED
Opiate, Ur Screen: NOT DETECTED
Phencyclidine (PCP) Ur S: NOT DETECTED
Tricyclic, Ur Screen: NOT DETECTED

## 2020-05-07 LAB — ETHANOL: Alcohol, Ethyl (B): <10 mg/dL (ref <10)

## 2020-05-07 LAB — SARS CORONAVIRUS 2 BY RT PCR (HOSPITAL ORDER, PERFORMED IN ~~LOC~~ HOSPITAL LAB): SARS Coronavirus 2: NEGATIVE

## 2020-05-07 MED ORDER — TRAZODONE HCL 100 MG PO TABS
100.0000 mg | ORAL_TABLET | Freq: Every day | ORAL | Status: DC
  Administered 2020-05-08 (×2): 100 mg via ORAL
  Filled 2020-05-07 (×2): qty 1

## 2020-05-07 NOTE — ED Notes (Signed)
Patient given snack.  

## 2020-05-07 NOTE — ED Notes (Signed)
Pt. Alert and oriented, warm and dry, in no distress. Pt. Denies HI, and AH. Patient states having SI thoughts that come and go with no plan and seeing like a window panel.  Patient contracts for Actor. Pt. Encouraged to let nursing staff know of any concerns or needs.   ENVIRONMENTAL ASSESSMENT Potentially harmful objects out of patient reach: Yes.   Personal belongings secured: Yes.   Patient dressed in hospital provided attire only: Yes.   Plastic bags out of patient reach: Yes.   Patient care equipment (cords, cables, call bells, lines, and drains) shortened, removed, or accounted for: Yes.   Equipment and supplies removed from bottom of stretcher: Yes.   Potentially toxic materials out of patient reach: Yes.   Sharps container removed or out of patient reach: Yes.

## 2020-05-07 NOTE — ED Notes (Signed)
Sent another red top lab requested.

## 2020-05-07 NOTE — ED Triage Notes (Signed)
Patient to ER via [facility] for c/o not being able to sleep and "not being able to get his thoughts to stop". Patient reports to taking "[employer] acid" on Saturday.

## 2020-05-07 NOTE — ED Notes (Signed)
IVC, pending consult orders

## 2020-05-07 NOTE — ED Notes (Signed)
Pt dressed into hospital provided attire via this RN and EDT, Gabby.  Belongings placed in labeled bag, to remain with [employer] officer until pt is roomed.  Belongings include:  Black Therapist, art Down  Blue Tshirt Hat Loafer Shoes Keys [employer] ; $220 cash

## 2020-05-07 NOTE — ED Triage Notes (Addendum)
Pt in via [facility], reports taking a tab of Acid to get these thoughts to go away, waking up 13 hours later in bathroom floor.  Pt does report suicidal ideation, ongoing for some time but is currently unmedicated.  Pt A/Ox3, NAD noted at this time.    Pt currently under IVC papers; [employer] remains with patient at this time.

## 2020-05-07 NOTE — ED Provider Notes (Signed)
[facility] Emergency Department Provider Note    First MD Initiated Contact with Patient 05/07/20 1513     (approximate)  I have reviewed the triage vital signs and the nursing notes.   HISTORY  Chief Complaint Suicidal    HPI Patrick Hardin is a 79 y.o. male the below listed past medical history presents to the ER for evaluation of dizziness feeling depressed and hopeless.  States he did ingest some acid as well as marijuana that he been purchasing over the past several weeks over the weekend.  States they are about 13hours over the weekend that he cannot account for states that he woke up naked next to the toilet.  Called EMS but did not come to the hospital.  States that he has been trying to rest and feel better but still feels like he is confused and like he is seeing things.  States he does have a history of depression.  Does not drink alcohol.  Denies any other pain or discomfort.     Past Medical History:  Diagnosis Date  . Depression   . Diverticulosis   . Hypertension   . PUD (peptic ulcer disease)    Family History  Problem Relation Age of Onset  . Leukemia Father    Past Surgical History:  Procedure Laterality Date  . CARDIAC CATHETERIZATION  2006   Stenting at [facility]  . CATARACT EXTRACTION W/ INTRAOCULAR LENS IMPLANT    . CORONARY ANGIOPLASTY WITH STENT PLACEMENT  2006  . HYDROCELE EXCISION / REPAIR  2014   repair. [facility]  . TONSILLECTOMY AND ADENOIDECTOMY  1950   Patient Active Problem List   Diagnosis Date Noted  . Bowel habit changes 07/13/2016  . Depression 07/13/2016  . Diverticulosis 07/13/2016  . Erectile dysfunction 07/13/2016  . Hypertension 07/13/2016  . Peptic ulcer disease 07/13/2016      Prior to Admission medications   Medication Sig Start Date End Date Taking? Authorizing Provider  aspirin 81 MG tablet Take 1 tablet by mouth daily.    [provider]  atorvastatin (LIPITOR) 40 MG tablet Take 1 tablet by  mouth daily.    [provider]  HYDROcodone-acetaminophen (NORCO) 7.5-325 MG tablet Take 1 tablet by mouth every 6 (six) hours as needed for moderate pain. 02/18/17   Maple Hudson., MD  lisinopril (PRINIVIL,ZESTRIL) 40 MG tablet Take 0.5 tablets by mouth daily.    [provider]  metroNIDAZOLE (FLAGYL) 500 MG tablet Take 1 tablet (500 mg total) by mouth 2 (two) times daily. Patient not taking: Reported on 12/01/2016 07/14/16   Malva Limes, MD  Multiple Vitamin (MULTI-VITAMINS) TABS Take by mouth.    [provider]  tadalafil (CIALIS) 20 MG tablet Take 1 tablet (20 mg total) by mouth daily as needed. 07/14/16   Malva Limes, MD    Allergies Advil [ibuprofen] and Viagra [sildenafil citrate]    Social History Social History   Tobacco Use  . Smoking status: Former Smoker    Types: Cigarettes  . Smokeless tobacco: Never Used  . Tobacco comment: quit in 1997  Vaping Use  . Vaping Use: Never used  Substance Use Topics  . Alcohol use: No  . Drug use: Yes    Types: Marijuana    Comment: Acid    Review of Systems Patient denies headaches, rhinorrhea, blurry vision, numbness, shortness of breath, chest pain, edema, cough, abdominal pain, nausea, vomiting, diarrhea, dysuria, fevers, rashes or hallucinations unless otherwise  stated above in HPI. ____________________________________________   PHYSICAL EXAM:  VITAL SIGNS: Vitals:   05/07/20 1232  BP: (!) 143/98  Pulse: 88  Resp: 20  Temp: 98.6 F (37 C)  SpO2: 98%    Constitutional: Alert and oriented.  Eyes: Conjunctivae are normal.  Head: Atraumatic. Nose: No congestion/rhinnorhea. Mouth/Throat: Mucous membranes are moist.   Neck: No stridor. Painless ROM.  Cardiovascular: Normal rate, regular rhythm. Grossly normal heart sounds.  Good peripheral circulation. Respiratory: Normal respiratory effort.  No retractions. Lungs CTAB. Gastrointestinal: Soft and nontender. No  distention. No abdominal bruits. No CVA tenderness. Genitourinary:  Musculoskeletal: No lower extremity tenderness nor edema.  No joint effusions. Neurologic:  Normal speech and language. No gross focal neurologic deficits are appreciated. No facial droop Skin:  Skin is warm, dry and intact. No rash noted. Psychiatric: calm, cooperative, Speech and behavior are normal.  ____________________________________________   LABS (all labs ordered are listed, but only abnormal results are displayed)  Results for orders placed or performed during the hospital encounter of 05/07/20 (from the past 24 hour(s))  Comprehensive metabolic panel     Status: Abnormal   Collection Time: 05/07/20 12:34 PM  Result Value Ref Range   Sodium 135 135 - 145 mmol/L   Potassium 3.9 3.5 - 5.1 mmol/L   Chloride 102 98 - 111 mmol/L   CO2 20 (L) 22 - 32 mmol/L   Glucose, Bld 94 70 - 99 mg/dL   BUN 19 8 - 23 mg/dL   Creatinine, Ser 0.99 0.61 - 1.24 mg/dL   Calcium 9.2 8.9 - 83.3 mg/dL   Total Protein 7.8 6.5 - 8.1 g/dL   Albumin 4.2 3.5 - 5.0 g/dL   AST 34 15 - 41 U/L   ALT 27 0 - 44 U/L   Alkaline Phosphatase 53 38 - 126 U/L   Total Bilirubin 2.5 (H) 0.3 - 1.2 mg/dL   GFR calc non Af Amer 59 (L) >60 mL/min   GFR calc Af Amer >60 >60 mL/min   Anion gap 13 5 - 15  Ethanol     Status: None   Collection Time: 05/07/20 12:34 PM  Result Value Ref Range   Alcohol, Ethyl (B) <10 <10 mg/dL  cbc     Status: Abnormal   Collection Time: 05/07/20 12:34 PM  Result Value Ref Range   WBC 14.3 (H) 4.0 - 10.5 K/uL   RBC 5.28 4.22 - 5.81 MIL/uL   Hemoglobin 16.0 13.0 - 17.0 g/dL   HCT 82.5 39 - 52 %   MCV 89.0 80.0 - 100.0 fL   MCH 30.3 26.0 - 34.0 pg   MCHC 34.0 30.0 - 36.0 g/dL   RDW 05.3 97.6 - 73.4 %   Platelets 262 150 - 400 K/uL   nRBC 0.0 0.0 - 0.2 %   ____________________________________________  EKG My review and personal interpretation at Time: 15:31   Indication: medication eval  Rate: 75  Rhythm:  sinus Axis: normal Other: normal intervals, no stemi ____________________________________________  RADIOLOGY  I personally reviewed all radiographic images ordered to evaluate for the above acute complaints and reviewed radiology reports and findings.  These findings were personally discussed with the patient.  Please see medical record for radiology report.  ____________________________________________   PROCEDURES  Procedure(s) performed:  Procedures    Critical Care performed: no ____________________________________________   INITIAL IMPRESSION / ASSESSMENT AND PLAN / ED COURSE  Pertinent labs & imaging results that were available during my care of the patient were  reviewed by me and considered in my medical decision making (see chart for details).   DDX: Psychosis, delirium, medication effect, noncompliance, polysubstance abuse, Si, Hi, depression   Patrick Hardin is a 79 y.o. who presents to the ED with for evaluation of ams/depression and SI after using LSD or some form of synthetic marijuana.  Patient has psych history of depression and substance abuse.  Laboratory testing was ordered to evaluation for underlying electrolyte derangement or signs of underlying organic pathology to explain today's presentation.  Based on history and physical and laboratory evaluation, it appears that the patient's presentation is 2/2 underlying psychiatric disorder and will require further evaluation and management by inpatient psychiatry.  Patient was  made an IVC due to SI.  Disposition pending psychiatric evaluation.  The patient has been placed in psychiatric observation due to the need to provide a safe environment for the patient while obtaining psychiatric consultation and evaluation, as well as ongoing medical and medication management to treat the patient's condition.  The patient has been placed under full IVC at this time.     The patient was evaluated in Emergency Department today  for the symptoms described in the history of present illness. He/she was evaluated in the context of the global COVID-19 pandemic, which necessitated consideration that the patient might be at risk for infection with the SARS-CoV-2 virus that causes COVID-19. Institutional protocols and algorithms that pertain to the evaluation of patients at risk for COVID-19 are in a state of rapid change based on information released by regulatory bodies including the CDC and federal and state organizations. These policies and algorithms were followed during the patient's care in the ED.  As part of my medical decision making, I reviewed the following data within the electronic MEDICAL RECORD NUMBER Nursing notes reviewed and incorporated, Labs reviewed, notes from prior ED visits and [place] Controlled Substance Database   ____________________________________________   FINAL CLINICAL IMPRESSION(S) / ED DIAGNOSES  Final diagnoses:  Suicidal ideation      NEW MEDICATIONS STARTED DURING THIS VISIT:  New Prescriptions   No medications on file     Note:  This document was prepared using Dragon voice recognition software and may include unintentional dictation errors.    Willy Eddy, MD 05/07/20 [phone]

## 2020-05-08 MED ORDER — LORATADINE 10 MG PO TABS
10.0000 mg | ORAL_TABLET | Freq: Every day | ORAL | Status: DC
  Administered 2020-05-08: 10 mg via ORAL
  Filled 2020-05-08: qty 1

## 2020-05-08 MED ORDER — ATORVASTATIN CALCIUM 20 MG PO TABS
40.0000 mg | ORAL_TABLET | Freq: Every day | ORAL | Status: DC
  Administered 2020-05-08: 40 mg via ORAL
  Filled 2020-05-08: qty 2

## 2020-05-08 MED ORDER — ASPIRIN EC 81 MG PO TBEC
81.0000 mg | DELAYED_RELEASE_TABLET | Freq: Every day | ORAL | Status: DC
  Administered 2020-05-08: 81 mg via ORAL
  Filled 2020-05-08: qty 1

## 2020-05-08 MED ORDER — OLANZAPINE 2.5 MG PO TABS
2.5000 mg | ORAL_TABLET | Freq: Every day | ORAL | Status: DC
  Administered 2020-05-08: 2.5 mg via ORAL
  Filled 2020-05-08: qty 1

## 2020-05-08 MED ORDER — HALOPERIDOL 0.5 MG PO TABS
0.2500 mg | ORAL_TABLET | Freq: Two times a day (BID) | ORAL | Status: DC
  Administered 2020-05-08: 0.25 mg via ORAL
  Filled 2020-05-08 (×2): qty 0.5

## 2020-05-08 MED ORDER — LOPERAMIDE HCL 2 MG PO CAPS
4.0000 mg | ORAL_CAPSULE | Freq: Once | ORAL | Status: AC
  Administered 2020-05-08: 4 mg via ORAL
  Filled 2020-05-08: qty 2

## 2020-05-08 MED ORDER — FLUTICASONE PROPIONATE 50 MCG/ACT NA SUSP
1.0000 | Freq: Every day | NASAL | Status: DC
  Filled 2020-05-08: qty 16

## 2020-05-08 MED ORDER — LISINOPRIL 20 MG PO TABS
20.0000 mg | ORAL_TABLET | Freq: Every day | ORAL | Status: DC
  Administered 2020-05-08: 20 mg via ORAL
  Filled 2020-05-08: qty 2

## 2020-05-08 MED ORDER — DULOXETINE HCL 20 MG PO CPEP
20.0000 mg | ORAL_CAPSULE | Freq: Every day | ORAL | Status: DC
  Filled 2020-05-08 (×2): qty 1
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
## 2020-05-08 NOTE — ED Provider Notes (Signed)
Emergency Medicine Observation Re-evaluation Note  Patrick Hardin is a 79 y.o. male, seen on rounds today.  Pt initially presented to the ED for complaints of Suicidal Currently, the patient is calm.  Physical Exam  BP (!) 146/78 (BP Location: Right Arm)   Pulse 87   Temp (!) 96.4 F (35.8 C) (Oral)   Resp 20   Ht 6' (1.829 m)   Wt 90.7 kg   SpO2 95%   BMI 27.12 kg/m  Physical Exam General: nad Cardiac: rrr Lungs: ctab Psych: not agitated  ED Course / MDM  EKG:    I have reviewed the labs performed to date as well as medications administered while in observation.  Recent changes in the last 24 hours include psych eval noting hallucinations and polysubstance abuse.  Plan  Current plan is for psych reassessment today. Patient is under full IVC at this time.   Sharman Cheek, MD 05/08/20 [phone]

## 2020-05-08 NOTE — BH Assessment (Signed)
Writer spoke with [employer] [phone] ext [phone]) about "Psych Beds" but didn't have any available because they were on diversion. Was advised to complete packet and faxed it to them for their documentation.  Writer spoke with [employer] ([phone]). Provided her with patient information and other requested information. Auth/Referrence #[id number].   Referral information for Psychiatric Hospitalization faxed to;   [facility] [place] [phone]),   . [facility] [phone] -or- [phone])   . [place] ([phone]---[phone]---[phone]),  . [facility] [phone], [phone], [phone] or [phone]),   . [place] [phone] or [phone])  . [facility] [phone]),   . [facility] [phone] -or- [phone]),   . [facility] [phone] or [phone])  . [place] [phone] or [phone]), will not accepted due to substance use.  [facility] [phone]).  [facility] [phone])

## 2020-05-08 NOTE — ED Notes (Signed)
Pt given meal tray and a cup of sprite.  

## 2020-05-08 NOTE — ED Notes (Signed)
Pt transferred into ED BHU room 5  Patient assigned to appropriate care area. Patient oriented to unit/care area: Informed that, for their safety, care areas are designed for safety and monitored by security cameras at all times; Visiting hours and phone times explained to patient. Patient verbalizes understanding, and verbal contract for safety obtained.   Assessment completed  He denies pain   He is awaiting geripsych placement

## 2020-05-08 NOTE — BH Assessment (Signed)
Writer spoke with the patient to complete an updated/reassessment. Patient denies SI/HI and AV/H. He reports of still having the effects of his substance use.

## 2020-05-08 NOTE — ED Notes (Signed)
Pt reports he came here because he bought some weed and lsd off the internet and he went on a trip as a result. Pt says he has done this before but it did not result in an er visit. Pt endorsing hallucinations with colorful colors and images. Pt denies AH at this time. Pt denies SI/HI on assessment.

## 2020-05-08 NOTE — Final Progress Note (Signed)
Physician Final Progress Note  Patient ID: Patrick Hardin MRN: [id number] DOB/AGE: 78-Mar-1942 78 y.o.  Admit date: 05/07/2020 Admitting provider: No admitting provider for patient encounter. 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 *  Same   Consults:   ER TTS and Psych  Significant Findings/ Diagnostic Studies: General mental status exam  Procedures:  Routine MD   Discharge Condition: \Awaits bed for gero psych cannot contract for safety   Disposition:   Awaits [facility] Psych  Diet:    As tolerated  Discharge Activity:  as tolerated     Patient awaits placement to [facility] psych  Wants to harm self if discharged.  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 no other drugs, except this LSD impulse he had   Court and legal issues none   Education at least HS   Career ---[facility]  Used to teach at [employer]    Mental status  Alert cooperative, less hallucinations when seen    Oriented times four   Mood and affect depressed and constricted Concentration and attention okay  Consciousness not clouded or fluctuant  Rapport, eye contact okay   Speech normal rate tone volume   Thought process and content ---depressive and anxious themes  Circumstantial   SI active   HI none   Movements no gross movements   Judgement insight reliability fair  Abstraction fair  Memory  remote recent immediate okay      Leans not known  Musculo skeltal normal Gait and station slow Assets --seeks help  Recall okay  Akathisia none Psychomotor normal  Handedness not known  Sleep erratic  ADL's needs assistance at home      Patient referred to [facility] psych for further treatment for depression where he is actively suicidal and for addictive personality needing support and structure  Meds  Started include  Haldol TID Zyprexa and Cymbalta     Total time spent taking care of this patient: 30-40  minutes  Signed: Roselind Messier 05/08/2020, 3:30 PM

## 2020-05-09 ENCOUNTER — Other Ambulatory Visit: Payer: Self-pay

## 2020-05-09 ENCOUNTER — Encounter: Payer: Self-pay | Admitting: Internal Medicine

## 2020-05-09 ENCOUNTER — Inpatient Hospital Stay
Admission: RE | Admit: 2020-05-09 | Discharge: 2020-05-17 | DRG: 885 | Disposition: A | Discharge status: Home / Self Care | Payer: Medicare Other | Source: Intra-hospital | Attending: Psychiatry | Admitting: Psychiatry

## 2020-05-09 DIAGNOSIS — Z8711 Personal history of peptic ulcer disease: Secondary | ICD-10-CM

## 2020-05-09 DIAGNOSIS — Y900 Blood alcohol level of less than 20 mg/100 ml: Secondary | ICD-10-CM | POA: Diagnosis present

## 2020-05-09 DIAGNOSIS — I10 Essential (primary) hypertension: Secondary | ICD-10-CM | POA: Diagnosis present

## 2020-05-09 DIAGNOSIS — K5790 Diverticulosis of intestine, part unspecified, without perforation or abscess without bleeding: Secondary | ICD-10-CM | POA: Diagnosis present

## 2020-05-09 DIAGNOSIS — F332 Major depressive disorder, recurrent severe without psychotic features: Principal | ICD-10-CM | POA: Diagnosis present

## 2020-05-09 DIAGNOSIS — Z87891 Personal history of nicotine dependence: Secondary | ICD-10-CM

## 2020-05-09 DIAGNOSIS — F1011 Alcohol abuse, in remission: Secondary | ICD-10-CM | POA: Diagnosis present

## 2020-05-09 DIAGNOSIS — Z20822 Contact with and (suspected) exposure to covid-19: Secondary | ICD-10-CM | POA: Diagnosis present

## 2020-05-09 DIAGNOSIS — F419 Anxiety disorder, unspecified: Secondary | ICD-10-CM | POA: Diagnosis present

## 2020-05-09 DIAGNOSIS — Z955 Presence of coronary angioplasty implant and graft: Secondary | ICD-10-CM | POA: Diagnosis not present

## 2020-05-09 DIAGNOSIS — G4700 Insomnia, unspecified: Secondary | ICD-10-CM | POA: Diagnosis present

## 2020-05-09 DIAGNOSIS — R45851 Suicidal ideations: Secondary | ICD-10-CM | POA: Diagnosis present

## 2020-05-09 DIAGNOSIS — F1210 Cannabis abuse, uncomplicated: Secondary | ICD-10-CM | POA: Diagnosis present

## 2020-05-09 DIAGNOSIS — Z888 Allergy status to other drugs, medicaments and biological substances status: Secondary | ICD-10-CM | POA: Diagnosis not present

## 2020-05-09 DIAGNOSIS — Z7982 Long term (current) use of aspirin: Secondary | ICD-10-CM | POA: Diagnosis not present

## 2020-05-09 DIAGNOSIS — Z79899 Other long term (current) drug therapy: Secondary | ICD-10-CM

## 2020-05-09 MED ORDER — OLANZAPINE 5 MG PO TABS: 2.5000 mg | ORAL_TABLET | Freq: Every day | ORAL | Status: DC

## 2020-05-09 MED ORDER — ESCITALOPRAM OXALATE 10 MG PO TABS
10.0000 mg | ORAL_TABLET | Freq: Every day | ORAL | Status: DC
  Administered 2020-05-09 – 2020-05-17 (×9): 10 mg via ORAL
  Filled 2020-05-09 (×9): qty 1

## 2020-05-09 MED ORDER — DOCUSATE SODIUM 100 MG PO CAPS
100.0000 mg | ORAL_CAPSULE | Freq: Two times a day (BID) | ORAL | Status: DC
  Administered 2020-05-09 – 2020-05-17 (×16): 100 mg via ORAL
  Filled 2020-05-09 (×16): qty 1

## 2020-05-09 MED ORDER — HALOPERIDOL 0.5 MG PO TABS
0.2500 mg | ORAL_TABLET | Freq: Two times a day (BID) | ORAL | Status: DC
  Administered 2020-05-09: 0.25 mg via ORAL
  Filled 2020-05-09 (×2): qty 0.5

## 2020-05-09 MED ORDER — SENNA 8.6 MG PO TABS
2.0000 | ORAL_TABLET | Freq: Every day | ORAL | Status: DC | PRN
  Administered 2020-05-10 – 2020-05-11 (×3): 17.2 mg via ORAL
  Filled 2020-05-09 (×4): qty 2

## 2020-05-09 MED ORDER — DULOXETINE HCL 20 MG PO CPEP
20.0000 mg | ORAL_CAPSULE | Freq: Every day | ORAL | Status: DC
  Administered 2020-05-09: 20 mg via ORAL
  Filled 2020-05-09: qty 1

## 2020-05-09 MED ORDER — MAGNESIUM HYDROXIDE 400 MG/5ML PO SUSP
30.0000 mL | Freq: Every day | ORAL | Status: DC | PRN
  Administered 2020-05-09 – 2020-05-10 (×2): 30 mL via ORAL
  Filled 2020-05-09 (×2): qty 30

## 2020-05-09 MED ORDER — ALUM & MAG HYDROXIDE-SIMETH 200-200-20 MG/5ML PO SUSP: 30.0000 mL | ORAL | Status: DC | PRN

## 2020-05-09 MED ORDER — TRAZODONE HCL 100 MG PO TABS: 100.0000 mg | ORAL_TABLET | Freq: Every day | ORAL | Status: DC

## 2020-05-09 MED ORDER — LORATADINE 10 MG PO TABS
10.0000 mg | ORAL_TABLET | Freq: Every day | ORAL | Status: DC
  Administered 2020-05-09 – 2020-05-17 (×9): 10 mg via ORAL
  Filled 2020-05-09 (×9): qty 1

## 2020-05-09 MED ORDER — ACETAMINOPHEN 325 MG PO TABS: 650.0000 mg | ORAL_TABLET | Freq: Four times a day (QID) | ORAL | Status: DC | PRN

## 2020-05-09 MED ORDER — ATORVASTATIN CALCIUM 20 MG PO TABS
40.0000 mg | ORAL_TABLET | Freq: Every day | ORAL | Status: DC
  Administered 2020-05-09 – 2020-05-16 (×8): 40 mg via ORAL
  Filled 2020-05-09 (×8): qty 2

## 2020-05-09 MED ORDER — TRAZODONE HCL 100 MG PO TABS
100.0000 mg | ORAL_TABLET | Freq: Once | ORAL | Status: AC
  Administered 2020-05-09: 100 mg via ORAL

## 2020-05-09 MED ORDER — LISINOPRIL 20 MG PO TABS
20.0000 mg | ORAL_TABLET | Freq: Every day | ORAL | Status: DC
  Administered 2020-05-09 – 2020-05-17 (×9): 20 mg via ORAL
  Filled 2020-05-09 (×9): qty 1

## 2020-05-09 MED ORDER — ASPIRIN EC 81 MG PO TBEC
81.0000 mg | DELAYED_RELEASE_TABLET | Freq: Every day | ORAL | Status: DC
  Administered 2020-05-09 – 2020-05-17 (×9): 81 mg via ORAL
  Filled 2020-05-09 (×9): qty 1

## 2020-05-09 MED ORDER — FLUTICASONE PROPIONATE 50 MCG/ACT NA SUSP
1.0000 | Freq: Every day | NASAL | Status: DC
  Administered 2020-05-10 – 2020-05-11 (×2): 1 via NASAL
  Filled 2020-05-09: qty 16

## 2020-05-09 NOTE — Progress Notes (Signed)
Patient calm and pleasant during assessment denying SI/HI/AVH, pain. Patient endorses anxiety and depression. Patient didn't have medications scheduled this evening but did request PRN sleep medications. See MAR. Patient isolative to his room and minimal with staff and peers. Patient being monitored Q 15 minutes for safety per unit protocol. Pt remains safe on the unit.

## 2020-05-09 NOTE — Progress Notes (Deleted)
Recreation Therapy Notes  Date: 05/09/2020  Time: 9:30 am   Location: Craft room     Behavioral response: N/A   Intervention Topic: Communication   Discussion/Intervention: Patient did not attend group.   Clinical Observations/Feedback:  Patient did not attend group.   Nashya Garlington LRT/CTRS        Thresa Dozier 05/09/2020 12:40 PM

## 2020-05-09 NOTE — [facility] Group Notes (Signed)
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  Modes of Intervention:  Support  Summary of Progress/Problems:  Mayra Neer 05/09/2020, 8:55 PM

## 2020-05-09 NOTE — H&P (Signed)
Psychiatric Admission Assessment Adult  Patient Identification: Patrick Hardin MRN:  [id number] Date of Evaluation:  05/09/2020 Chief Complaint:  Severe recurrent major depression (HCC) [F33.2] Principal Diagnosis: Severe recurrent major depression (HCC) Diagnosis:  Principal Problem:   Severe recurrent major depression (HCC) Active Problems:   Hypertension   Cannabis abuse  History of Present Illness: Patient seen chart reviewed.  This is a 79 year old man who came to the emergency room complaining of physical discomfort and confusion related to LSD abuse.  Patient says that he has been feeling depressed for months.  He has been talking to counselors at the [place] but not been on any medication for it.  His depression and anxiety have been worse and he had been recently using marijuana daily for it.  He ran out of marijuana a couple days ago and so somewhat impulsively decided to take some LSD that he had bought several months previously.  He took it on Saturday and Sunday morning woke up very confused on the floor of his bathroom hallucinating feeling physically sick.  He ended up having to call 911 twice before he decided to come into the hospital.  He says he is still having some visual perceptual changes but they are getting better.  Mood was depressed sad down negative.  Describes himself as feeling worthless.  Has very little positive support in his life.  Does not sleep very well.  Appetite not good.  Has considered suicide but does not report any current specific plan.  At baseline denies any hallucinations.  Not taking any antidepressant medicine.  Major life stresses are that he lives alone after the death of his wife a couple years ago.  Not working.  Sounds like he has minimal support.  He says his biggest emotional support was alcoholics anonymous groups but since the pandemic is happened those are much less available. Associated Signs/Symptoms: Depression Symptoms:  insomnia, feelings of  worthlessness/guilt, difficulty concentrating, hopelessness, suicidal thoughts without plan, anxiety, loss of energy/fatigue, Duration of Depression Symptoms: No data recorded (Hypo) Manic Symptoms:  Impulsivity, Anxiety Symptoms:  Excessive Worry, Psychotic Symptoms:  Hallucinations: Visual Hallucinations only present while intoxicated with hallucinogens Duration of Psychotic Symptoms: No data recorded PTSD Symptoms: Negative Total Time spent with patient: 1 hour  Past Psychiatric History: No previous psychiatric hospitalizations other than for treatment of alcohol abuse over 20 years ago.  These were more of detox treatment.  Past history of alcohol abuse but says he has maintained sobriety for over 20 years by AA attendance.  No previous treatment with antidepressants.  Denies any history of suicide attempts  Is the patient at risk to self? Yes.    Has the patient been a risk to self in the past 6 months? Yes.    Has the patient been a risk to self within the distant past? No.  Is the patient a risk to others? No.  Has the patient been a risk to others in the past 6 months? No.  Has the patient been a risk to others within the distant past? No.   Prior Inpatient Therapy:   Prior Outpatient Therapy:    Alcohol Screening: 1. How often do you have a drink containing alcohol?: Never 2. How many drinks containing alcohol do you have on a typical day when you are drinking?: 1 or 2 3. How often do you have six or more drinks on one occasion?: Never AUDIT-C Score: 0 4. How often during the last year have you found that  you were not able to stop drinking once you had started?: Never 5. How often during the last year have you failed to do what was normally expected from you because of drinking?: Never 6. How often during the last year have you needed a first drink in the morning to get yourself going after a heavy drinking session?: Never 7. How often during the last year have you had a  feeling of guilt of remorse after drinking?: Never 8. How often during the last year have you been unable to remember what happened the night before because you had been drinking?: Never 9. Have you or someone else been injured as a result of your drinking?: No 10. Has a relative or friend or a doctor or another health worker been concerned about your drinking or suggested you cut down?: No Alcohol Use Disorder Identification Test Final Score (AUDIT): 0 Alcohol Brief Interventions/Follow-up: AUDIT Score <7 follow-up not indicated Substance Abuse History in the last 12 months:  Yes.   Consequences of Substance Abuse: Medical Consequences:  Physical distress sickness probably worsening of depression Previous Psychotropic Medications: No  Psychological Evaluations: Yes  Past Medical History:  Past Medical History:  Diagnosis Date  . Depression   . Diverticulosis   . Hypertension   . PUD (peptic ulcer disease)     Past Surgical History:  Procedure Laterality Date  . CARDIAC CATHETERIZATION  2006   [facility] at [facility]  . CATARACT EXTRACTION W/ INTRAOCULAR LENS IMPLANT    . CORONARY ANGIOPLASTY WITH STENT PLACEMENT  2006  . HYDROCELE EXCISION / REPAIR  2014   repair. [facility]  . TONSILLECTOMY AND ADENOIDECTOMY  1950   Family History:  Family History  Problem Relation Age of Onset  . Leukemia Father    Family Psychiatric  History: Patient has a son who has also suffered from substance abuse problems Tobacco Screening: Have you used any form of tobacco in the last 30 days? (Cigarettes, Smokeless Tobacco, Cigars, and/or Pipes): Yes Tobacco use, Select all that apply: 5 or more cigarettes per day Are you interested in Tobacco Cessation Medications?: No, patient refused Counseled patient on smoking cessation including recognizing danger situations, developing coping skills and basic information about quitting provided: Refused/Declined practical counseling Social History:  Social History    Substance and Sexual Activity  Alcohol Use No     Social History   Substance and Sexual Activity  Drug Use Yes  . Types: Marijuana   Comment: Acid    Additional Social History:                           Allergies:   Allergies  Allergen Reactions  . Advil [Ibuprofen] Other (See Comments)    Gel Caps; Seizure  . Viagra [Sildenafil Citrate] Other (See Comments)    Headaches   Lab Results:  Results for orders placed or performed during the hospital encounter of 05/07/20 (from the past 48 hour(s))  SARS Coronavirus 2 by RT PCR (hospital order, performed in [facility] hospital lab) Nasopharyngeal Nasopharyngeal Swab     Status: None   Collection Time: 05/07/20  4:58 PM   Specimen: Nasopharyngeal Swab  Result Value Ref Range   SARS Coronavirus 2 NEGATIVE NEGATIVE    Comment: (NOTE) SARS-CoV-2 target nucleic acids are NOT DETECTED.  The SARS-CoV-2 RNA is generally detectable in upper and lower respiratory specimens during the acute phase of infection. The lowest concentration of SARS-CoV-2 viral copies this assay can  detect is 250 copies / mL. A negative result does not preclude SARS-CoV-2 infection and should not be used as the sole basis for treatment or other patient management decisions.  A negative result may occur with improper specimen collection / handling, submission of specimen other than nasopharyngeal swab, presence of viral mutation(s) within the areas targeted by this assay, and inadequate number of viral copies (<250 copies / mL). A negative result must be combined with clinical observations, patient history, and epidemiological information.  Fact Sheet for Patients:   BoilerBrush.com.cy  Fact Sheet for Healthcare Providers: https://pope.com/  This test is not yet approved or  cleared by the [place] FDA and has been authorized for detection and/or diagnosis of SARS-CoV-2 by FDA under an  Emergency Use Authorization (EUA).  This EUA will remain in effect (meaning this test can be used) for the duration of the COVID-19 declaration under Section 564(b)(1) of the Act, 21 U.S.C. section 360bbb-3(b)(1), unless the authorization is terminated or revoked sooner.  Performed at [facility], [address] [place], [place] [address]     Blood Alcohol level:  Lab Results  Component Value Date   [facility] <10 05/07/2020    Metabolic Disorder Labs:  No results found for: HGBA1C, MPG No results found for: PROLACTIN No results found for: CHOL, TRIG, HDL, CHOLHDL, VLDL, LDLCALC  Current Medications: Current Facility-Administered Medications  Medication Dose Route Frequency Provider Last Rate Last Admin  . acetaminophen (TYLENOL) tablet 650 mg  650 mg Oral Q6H PRN Nira Conn A, NP      . alum & mag hydroxide-simeth (MAALOX/MYLANTA) 200-200-20 MG/5ML suspension 30 mL  30 mL Oral Q4H PRN Nira Conn A, NP      . aspirin EC tablet 81 mg  81 mg Oral Daily Nira Conn A, NP   81 mg at 05/09/20 0814  . atorvastatin (LIPITOR) tablet 40 mg  40 mg Oral q1800 Nira Conn A, NP      . docusate sodium (COLACE) capsule 100 mg  100 mg Oral BID Min Collymore, Timoteo T, MD      . escitalopram (LEXAPRO) tablet 10 mg  10 mg Oral Daily Kauri Garson, Jackquline Denmark, MD   10 mg at 05/09/20 1216  . fluticasone (FLONASE) 50 MCG/ACT nasal spray 1 spray  1 spray Each Nare Daily Nira Conn A, NP      . lisinopril (ZESTRIL) tablet 20 mg  20 mg Oral Daily Nira Conn A, NP   20 mg at 05/09/20 0814  . loratadine (CLARITIN) tablet 10 mg  10 mg Oral Daily Nira Conn A, NP   10 mg at 05/09/20 0814  . magnesium hydroxide (MILK OF MAGNESIA) suspension 30 mL  30 mL Oral Daily PRN Nira Conn A, NP   30 mL at 05/09/20 1216  . senna (SENOKOT) tablet 17.2 mg  2 tablet Oral Daily PRN Tavarious Freel, Jackquline Denmark, MD       PTA Medications: Medications Prior to Admission  Medication Sig Dispense Refill Last Dose  . aspirin 81 MG  tablet Take 1 tablet by mouth daily.     Marland Kitchen atorvastatin (LIPITOR) 40 MG tablet Take 1 tablet by mouth daily.     . fluticasone (FLONASE) 50 MCG/ACT nasal spray Place 1 spray into both nostrils daily.     Marland Kitchen lisinopril (PRINIVIL,ZESTRIL) 40 MG tablet Take 0.5 tablets by mouth daily.     Marland Kitchen loratadine (CLARITIN) 10 MG tablet Take by mouth.     . metoprolol succinate (TOPROL-XL) 50 MG  24 hr tablet Take by mouth.     . Multiple Vitamin (MULTI-VITAMINS) TABS Take by mouth.       Musculoskeletal: Strength & Muscle Tone: within normal limits Gait & Station: normal Patient leans: N/A  Psychiatric Specialty Exam: Physical Exam Vitals and nursing note reviewed.  Constitutional:      Appearance: He is well-developed.  HENT:     Head: Normocephalic and atraumatic.  Eyes:     Conjunctiva/sclera: Conjunctivae normal.     Pupils: Pupils are equal, round, and reactive to light.  Cardiovascular:     Heart sounds: Normal heart sounds.  Pulmonary:     Effort: Pulmonary effort is normal.  Abdominal:     Palpations: Abdomen is soft.  Musculoskeletal:        General: Normal range of motion.     Cervical back: Normal range of motion.  Skin:    General: Skin is warm and dry.  Neurological:     General: No focal deficit present.     Mental Status: He is alert.  Psychiatric:        Attention and Perception: Attention normal.        Mood and Affect: Mood is anxious and depressed. Affect is blunt.        Speech: Speech is delayed.        Behavior: Behavior is withdrawn.        Thought Content: Thought content includes suicidal ideation. Thought content does not include suicidal plan.        Cognition and Memory: Cognition normal.        Judgment: Judgment normal.     Review of Systems  Constitutional: Negative.   HENT: Negative.   Eyes: Negative.   Respiratory: Negative.   Cardiovascular: Negative.   Gastrointestinal: Negative.   Musculoskeletal: Negative.   Skin: Negative.   Neurological:  Negative.   Psychiatric/Behavioral: Positive for behavioral problems, dysphoric mood and suicidal ideas. The patient is nervous/anxious.     Blood pressure 122/80, pulse 88, temperature 98.2 F (36.8 C), temperature source Oral, resp. rate 17, height 6' (1.829 m), weight 90.7 kg, SpO2 97 %.Body mass index is 27.12 kg/m.  General Appearance: Casual  Eye Contact:  Fair  Speech:  Slow  Volume:  Decreased  Mood:  Depressed  Affect:  Congruent  Thought Process:  Coherent  Orientation:  Full (Time, Place, and Person)  Thought Content:  Logical  Suicidal Thoughts:  Yes.  without intent/plan  Homicidal Thoughts:  No  Memory:  Immediate;   Fair Recent;   Fair Remote;   Fair  Judgement:  Impaired  Insight:  Shallow  Psychomotor Activity:  Decreased  Concentration:  Concentration: Fair  Recall:  [employer]  Fund of Knowledge:  Fair  Language:  Fair  Akathisia:  No  Handed:  Right  AIMS (if indicated):     Assets:  Desire for Improvement Housing  ADL's:  Impaired  Cognition:  WNL  Sleep:  Number of Hours: 5.25    Treatment Plan Summary: Daily contact with patient to assess and evaluate symptoms and progress in treatment, Medication management and Plan Review use of medication.  Start Lexapro for major depression.  Continue other medicines for blood pressure and elevated lipids as were previously used.  Engage in individual and group therapy.  Review labs.  Work on making sure we have more safety and an appropriate discharge plan with follow-up mental health care when he is released.  Observation Level/Precautions:  15 minute checks  Laboratory:  Chemistry Profile  Psychotherapy:    Medications:    Consultations:    Discharge Concerns:    Estimated LOS:  Other:     Physician Treatment Plan for Primary Diagnosis: Severe recurrent major depression (HCC) Long Term Goal(s): Improvement in symptoms so as ready for discharge  Short Term Goals: Ability to verbalize feelings will improve and  Ability to disclose and discuss suicidal ideas  Physician Treatment Plan for Secondary Diagnosis: Principal Problem:   Severe recurrent major depression (HCC) Active Problems:   Hypertension   Cannabis abuse  Long Term Goal(s): Improvement in symptoms so as ready for discharge  Short Term Goals: Ability to maintain clinical measurements within normal limits will improve and Compliance with prescribed medications will improve  I certify that inpatient services furnished can reasonably be expected to improve the patient's condition.    Mordecai Rasmussen, MD 9/16/20214:14 PM

## 2020-05-09 NOTE — Progress Notes (Signed)
Patient admitted from [facility], report received from [place], [place]. Skin assessment completed with Marylu Lund, no abnormalities found. Patient stated that he hasn't been sleeping well and just wanted to go to bed. Patient was calm and pleasant during assessment. Pt denies SI/HI/AVH with this Clinical research associate. Patient states he has anxiety and depression and that is why he has been self medicating with marijuana and LSD. Patient denies alcohol use. Patient given education, support, and encouragement to be active in his treatment plan. Patient being monitored Q 15 minutes for safety per unit protocol. Pt remains safe on the unit.

## 2020-05-09 NOTE — Plan of Care (Signed)
Patient new to the unit tonight, hasn't had time to progress  Problem: Education: Goal: Knowledge of [place] [facility] Education information/materials will improve Outcome: Not Progressing Goal: Emotional status will improve Outcome: Not Progressing Goal: Mental status will improve Outcome: Not Progressing Goal: Verbalization of understanding the information provided will improve Outcome: Not Progressing   Problem: Activity: Goal: Interest or engagement in activities will improve Outcome: Not Progressing Goal: Sleeping patterns will improve Outcome: Not Progressing   Problem: Coping: Goal: Ability to verbalize frustrations and anger appropriately will improve Outcome: Not Progressing Goal: Ability to demonstrate self-control will improve Outcome: Not Progressing

## 2020-05-09 NOTE — BHH Suicide Risk Assessment (Signed)
[facility] Admission Suicide Risk Assessment   Nursing information obtained from:  Patient Demographic factors:  NA Current Mental Status:  NA Loss Factors:  NA Historical Factors:  NA Risk Reduction Factors:  NA  Total Time spent with patient: 1 hour Principal Problem: Severe recurrent major depression (HCC) Diagnosis:  Principal Problem:   Severe recurrent major depression (HCC) Active Problems:   Hypertension   Cannabis abuse  Subjective Data: Patient seen and chart reviewed.  This is a 79 year old man admitted through the emergency room who made statements about suicide.  He repeats to me that he has recently been having more thoughts about killing himself.  He feels depressed hopeless and negative and is not currently receiving any mental health treatment.  Not actively psychotic.  Willing to engage in treatment.  Continued Clinical Symptoms:  Alcohol Use Disorder Identification Test Final Score (AUDIT): 0 The "Alcohol Use Disorders Identification Test", Guidelines for Use in Primary Care, Second Edition.  World Science writer [facility]). Score between 0-7:  no or low risk or alcohol related problems. Score between 8-15:  moderate risk of alcohol related problems. Score between 16-19:  high risk of alcohol related problems. Score 20 or above:  warrants further diagnostic evaluation for alcohol dependence and treatment.   CLINICAL FACTORS:   Depression:   Comorbid alcohol abuse/dependence   Musculoskeletal: Strength & Muscle Tone: within normal limits Gait & Station: normal Patient leans: N/A  Psychiatric Specialty Exam: Physical Exam Vitals and nursing note reviewed.  Constitutional:      Appearance: He is well-developed.  HENT:     Head: Normocephalic and atraumatic.  Eyes:     Conjunctiva/sclera: Conjunctivae normal.     Pupils: Pupils are equal, round, and reactive to light.  Cardiovascular:     Heart sounds: Normal heart sounds.  Pulmonary:     Effort: Pulmonary  effort is normal.  Abdominal:     Palpations: Abdomen is soft.  Musculoskeletal:        General: Normal range of motion.     Cervical back: Normal range of motion.  Skin:    General: Skin is warm and dry.  Neurological:     General: No focal deficit present.     Mental Status: He is alert.  Psychiatric:        Attention and Perception: Attention normal.        Mood and Affect: Mood is anxious and depressed. Affect is blunt.        Speech: Speech is delayed.        Behavior: Behavior is slowed.        Thought Content: Thought content includes suicidal ideation. Thought content does not include suicidal plan.        Cognition and Memory: Cognition is impaired.        Judgment: Judgment is impulsive.     Review of Systems  Constitutional: Negative.   HENT: Negative.   Eyes: Negative.   Respiratory: Negative.   Cardiovascular: Negative.   Gastrointestinal: Negative.   Musculoskeletal: Negative.   Skin: Negative.   Neurological: Negative.   Psychiatric/Behavioral: Positive for confusion, dysphoric mood and suicidal ideas. The patient is nervous/anxious.     Blood pressure 122/80, pulse 88, temperature 98.2 F (36.8 C), temperature source Oral, resp. rate 17, height 6' (1.829 m), weight 90.7 kg, SpO2 97 %.Body mass index is 27.12 kg/m.  General Appearance: Casual  Eye Contact:  Fair  Speech:  Slow  Volume:  Decreased  Mood:  Depressed  Affect:  Congruent  Thought Process:  Disorganized  Orientation:  Full (Time, Place, and Person)  Thought Content:  Rumination  Suicidal Thoughts:  Yes.  without intent/plan  Homicidal Thoughts:  No  Memory:  Immediate;   Fair Recent;   Fair Remote;   Fair  Judgement:  Fair  Insight:  Fair  Psychomotor Activity:  Decreased  Concentration:  Concentration: Poor  Recall:  [employer] of Knowledge:  Fair  Language:  Fair  Akathisia:  No  Handed:  Right  AIMS (if indicated):     Assets:  Desire for Improvement Housing Physical  Health Resilience  ADL's:  Intact  Cognition:  WNL  Sleep:  Number of Hours: 5.25      COGNITIVE FEATURES THAT CONTRIBUTE TO RISK:  Thought constriction (tunnel vision)    SUICIDE RISK:   Mild:  Suicidal ideation of limited frequency, intensity, duration, and specificity.  There are no identifiable plans, no associated intent, mild dysphoria and related symptoms, good self-control (both objective and subjective assessment), few other risk factors, and identifiable protective factors, including available and accessible social support.  PLAN OF CARE: Continue 15-minute checks.  Initiate appropriate treatment for depression.  Engage in individual and group therapy.  Work on possible referral for outpatient treatment at the [place].  Ongoing reassessment of dangerousness prior to discharge  I certify that inpatient services furnished can reasonably be expected to improve the patient's condition.   Mordecai Rasmussen, MD 05/09/2020, 4:11 PM

## 2020-05-09 NOTE — Plan of Care (Signed)
Patient endorses anxiety/depression but stated he was feeling better this evening   Problem: Education: Goal: Emotional status will improve Outcome: Progressing Goal: Mental status will improve Outcome: Progressing

## 2020-05-09 NOTE — Progress Notes (Signed)
Recreation Therapy Notes  Date: 05/09/2020  Time: 9:30 am   Location: Craft room     Behavioral response: N/A   Intervention Topic: Communication   Discussion/Intervention: Patient did not attend group.   Clinical Observations/Feedback:  Patient did not attend group.   Colleen Donahoe LRT/CTRS        Giselle Brutus 05/09/2020 12:46 PM

## 2020-05-09 NOTE — Tx Team (Addendum)
Interdisciplinary Treatment and Diagnostic Plan Update  05/09/2020 Time of Session: 9:00AM  MAYSIN CARSTENS MRN: [id number]  Principal Diagnosis: <principal problem not specified>  Secondary Diagnoses: Active Problems:   Severe recurrent major depression (HCC)   Current Medications:  Current Facility-Administered Medications  Medication Dose Route Frequency Provider Last Rate Last Admin  . acetaminophen (TYLENOL) tablet 650 mg  650 mg Oral Q6H PRN Lindon Romp A, NP      . alum & mag hydroxide-simeth (MAALOX/MYLANTA) 200-200-20 MG/5ML suspension 30 mL  30 mL Oral Q4H PRN Lindon Romp A, NP      . aspirin EC tablet 81 mg  81 mg Oral Daily Lindon Romp A, NP   81 mg at 05/09/20 0814  . atorvastatin (LIPITOR) tablet 40 mg  40 mg Oral q1800 Lindon Romp A, NP      . docusate sodium (COLACE) capsule 100 mg  100 mg Oral BID Clapacs, Jaydence T, MD      . escitalopram (LEXAPRO) tablet 10 mg  10 mg Oral Daily Clapacs, Madie Reno, MD   10 mg at 05/09/20 1216  . fluticasone (FLONASE) 50 MCG/ACT nasal spray 1 spray  1 spray Each Nare Daily Lindon Romp A, NP      . lisinopril (ZESTRIL) tablet 20 mg  20 mg Oral Daily Lindon Romp A, NP   20 mg at 05/09/20 0814  . loratadine (CLARITIN) tablet 10 mg  10 mg Oral Daily Lindon Romp A, NP   10 mg at 05/09/20 0814  . magnesium hydroxide (MILK OF MAGNESIA) suspension 30 mL  30 mL Oral Daily PRN Lindon Romp A, NP   30 mL at 05/09/20 1216  . senna (SENOKOT) tablet 17.2 mg  2 tablet Oral Daily PRN Clapacs, Madie Reno, MD       PTA Medications: Medications Prior to Admission  Medication Sig Dispense Refill Last Dose  . aspirin 81 MG tablet Take 1 tablet by mouth daily.     Marland Kitchen atorvastatin (LIPITOR) 40 MG tablet Take 1 tablet by mouth daily.     . fluticasone (FLONASE) 50 MCG/ACT nasal spray Place 1 spray into both nostrils daily.     Marland Kitchen lisinopril (PRINIVIL,ZESTRIL) 40 MG tablet Take 0.5 tablets by mouth daily.     Marland Kitchen loratadine (CLARITIN) 10 MG tablet Take by mouth.      . metoprolol succinate (TOPROL-XL) 50 MG 24 hr tablet Take by mouth.     . Multiple Vitamin (MULTI-VITAMINS) TABS Take by mouth.       Patient Stressors:    Patient Strengths:    Treatment Modalities: Medication Management, Group therapy, Case management,  1 to 1 session with clinician, Psychoeducation, Recreational therapy.   Physician Treatment Plan for Primary Diagnosis: <principal problem not specified> Long Term Goal(s):     Short Term Goals:    Medication Management: Evaluate patient's response, side effects, and tolerance of medication regimen.  Therapeutic Interventions: 1 to 1 sessions, Unit Group sessions and Medication administration.  Evaluation of Outcomes: Not Met  Physician Treatment Plan for Secondary Diagnosis: Active Problems:   Severe recurrent major depression ([place])  Long Term Goal(s):     Short Term Goals:       Medication Management: Evaluate patient's response, side effects, and tolerance of medication regimen.  Therapeutic Interventions: 1 to 1 sessions, Unit Group sessions and Medication administration.  Evaluation of Outcomes: Not Met   RN Treatment Plan for Primary Diagnosis: <principal problem not specified> Long Term Goal(s): Knowledge of disease and therapeutic  regimen to maintain health will improve  Short Term Goals: Ability to verbalize frustration and anger appropriately will improve, Ability to demonstrate self-control, Ability to participate in decision making will improve, Ability to verbalize feelings will improve, Ability to identify and develop effective coping behaviors will improve and Compliance with prescribed medications will improve  Medication Management: RN will administer medications as ordered by provider, will assess and evaluate patient's response and provide education to patient for prescribed medication. RN will report any adverse and/or side effects to prescribing provider.  Therapeutic Interventions: 1 on 1  counseling sessions, Psychoeducation, Medication administration, Evaluate responses to treatment, Monitor vital signs and CBGs as ordered, Perform/monitor CIWA, COWS, AIMS and Fall Risk screenings as ordered, Perform wound care treatments as ordered.  Evaluation of Outcomes: Not Met   LCSW Treatment Plan for Primary Diagnosis: <principal problem not specified> Long Term Goal(s): Safe transition to appropriate next level of care at discharge, Engage patient in therapeutic group addressing interpersonal concerns.  Short Term Goals: Engage patient in aftercare planning with referrals and resources, Increase social support, Increase ability to appropriately verbalize feelings, Increase emotional regulation, Facilitate acceptance of mental health diagnosis and concerns, Facilitate patient progression through stages of change regarding substance use diagnoses and concerns, Identify triggers associated with mental health/substance abuse issues and Increase skills for wellness and recovery  Therapeutic Interventions: Assess for all discharge needs, 1 to 1 time with Social worker, Explore available resources and support systems, Assess for adequacy in community support network, Educate family and significant other(s) on suicide prevention, Complete Psychosocial Assessment, Interpersonal group therapy.  Evaluation of Outcomes: Not Met   Progress in Treatment: Attending groups: No. Participating in groups: No. Taking medication as prescribed: Yes. Toleration medication: Yes. Family/Significant other contact made: No, will contact:  pt declined collateral contact.  SPE completed with the patient.  Patient understands diagnosis: Yes. Discussing patient identified problems/goals with staff: Yes. Medical problems stabilized or resolved: Yes. Denies suicidal/homicidal ideation: Yes. Issues/concerns per patient self-inventory: No. Other: none  New problem(s) identified: No, Describe:  none  New Short  Term/Long Term Goal(s): detox, elimination of symptoms of psychosis, medication management for mood stabilization; elimination of SI thoughts; development of comprehensive mental wellness/sobriety plan.  Patient Goals:  "I don't know"  Discharge Plan or Barriers: Patient reports that he plans on returning to his home.  He reports that he will follow up with the [place] in [place].  Reason for Continuation of Hospitalization: Anxiety Depression Medication stabilization Suicidal ideation  Estimated Length of Stay:  1-7 days  Recreational Therapy: Patient: N/A Patient Goal: Patient will engage in groups without prompting or encouragement from LRT x3 group sessions within 5 recreation therapy group sessions  Attendees: Patient: Jarmel Linhardt 05/09/2020 1:02 PM  Physician: Dr. Weber Cooks, MD 05/09/2020 1:02 PM  Nursing: [place] Pugh, RN 05/09/2020 1:02 PM  RN Care Manager: 05/09/2020 1:02 PM  Social Worker: Assunta Curtis, [place] 05/09/2020 1:02 PM  Recreational Therapist: Roanna Epley, CTRS, LRT 05/09/2020 1:02 PM  Other:  05/09/2020 1:02 PM  Other:  05/09/2020 1:02 PM  Other: 05/09/2020 1:02 PM    Scribe for Treatment Team: Rozann Lesches, LCSW 05/09/2020 1:02 PM

## 2020-05-09 NOTE — Progress Notes (Signed)
Pt is alert and oriented to person, place, time and situation. Pt is calm, cooperative, affect is flat, makes fair eye contact, pt at times is irritable, and uses profanity but then apologizes. Pt isolates in his room, comes out for breakfast, appetite is good, then returned to his room to rest quietly in bed. Pt denies suicidal and homicidal ideation, denies hallucinations, denies feelings of anxiety, reports feelings of depression. Pt is medication compliant.

## 2020-05-10 MED ORDER — HYPROMELLOSE (GONIOSCOPIC) 2.5 % OP SOLN
2.0000 [drp] | OPHTHALMIC | Status: DC | PRN
  Filled 2020-05-10: qty 15

## 2020-05-10 MED ORDER — PSYLLIUM 95 % PO PACK
1.0000 | PACK | Freq: Every day | ORAL | Status: DC
  Administered 2020-05-11 – 2020-05-17 (×7): 1 via ORAL
  Filled 2020-05-10 (×7): qty 1

## 2020-05-10 NOTE — BHH Counselor (Signed)
Adult Comprehensive Assessment  Patient ID: Patrick Hardin, male   DOB: [birth year], 79 y.o.   MRN: [id number]  Information Source: Information source: Patient  Current Stressors:  Patient states their primary concerns and needs for treatment are:: "a bad acid trip" Patient states their goals for this hospitilization and ongoing recovery are:: "kick my use of that sort of thing" Educational / Learning stressors: Pt denies. Employment / Job issues: Pt denies. Family Relationships: Pt denies. Financial / Lack of resources (include bankruptcy): Pt denies. Housing / Lack of housing: Pt denies. Physical health (include injuries & life threatening diseases): "I have a blood pressure problem and have had 2 stints" Social relationships: "I don't have any friends" Substance abuse: "marijuana, acid" Bereavement / Loss: "my wife died a few years ago, mother around the same time"  Living/Environment/Situation:  Living Arrangements: Alone How long has patient lived in current situation?: "since 2018" What is atmosphere in current home: Comfortable  Family History:  Marital status: Widowed Widowed, when?: "a few years back" Does patient have children?: Yes How many children?: 2 How is patient's relationship with their children?: "My daughter is in [place], we haven't spoken since she told me that she didn't want me in her life anymore, that's been about 10 years, with my son we were able to rebuild something"  Childhood History:  By whom was/is the patient raised?: Both parents Additional childhood history information: Pt reports that his parents divorced "when I was 5 or 6, so my mom and grandpa raised me until my mother remarried." Description of patient's relationship with caregiver when they were a child: "It was great" How were you disciplined when you got in trouble as a child/adolescent?: "I really didn't get into trouble" Does patient have siblings?: Yes Number of Siblings: 4 Description of  patient's current relationship with siblings: "It's good but it was false, but I haven't told them that I was using" Did patient suffer any verbal/emotional/physical/sexual abuse as a child?: No Did patient suffer from severe childhood neglect?: No Has patient ever been sexually abused/assaulted/raped as an adolescent or adult?: No Was the patient ever a victim of a crime or a disaster?: No Witnessed domestic violence?: No Has patient been affected by domestic violence as an adult?: No  Education:  Highest grade of school patient has completed: Masters in Chief of Staff Currently a Consulting civil engineer?: No Learning disability?: No  Employment/Work Situation:   Employment situation: Retired Therapist, 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  He reports that he did not receive treatment for mental health or SA while in service.)  Financial Resources:   Financial resources: [employer], Receives SSI ("investments") Does patient have a representative payee or guardian?: No  Alcohol/Substance Abuse:   What has been your use of drugs/alcohol within the last 12 months?: Marijuana: "order online ever couple of months, I got to where I was smoking 1oz straight all day" LSD: "I ordered it online as well and it was a one time thing" Pt reports hisotry of alcohol consumption. If attempted suicide, did drugs/alcohol play a role in this?: No Alcohol/Substance Abuse Treatment Hx: Past detox, Past Tx, Inpatient Has alcohol/substance abuse ever caused legal problems?: No  Social Support System:   Patient's Community Support System: None Describe Community Support System: Pt denies.  Type of faith/religion: "Catholicism" How does patient's faith help to cope with current illness?: "I'm trying it but  haven't been to church in a while"  Leisure/Recreation:   Do You Have Hobbies?: Yes Leisure and Hobbies: "travel, playing piano, write poetry, I'm a self-published Chartered loss adjuster"  Strengths/Needs:   What is the patient's perception of their strengths?: "think that when my mind is working right, I can think pretty clearly" Patient states they can use these personal strengths during their treatment to contribute to their recovery: Pt denies. Patient states these barriers may affect/interfere with their treatment: "fear" Patient states these barriers may affect their return to the community: Pt denies.  Discharge Plan:   Currently receiving community mental health services: No Patient states concerns and preferences for aftercare planning are: Pt reports that [facility]. Patient states they will know when they are safe and ready for discharge when: "I don't know, really" Does patient have access to transportation?: No Does patient have financial barriers related to discharge medications?: No Plan for no access to transportation at discharge: CSW will assist with transportation needs. Will patient be returning to same living situation after discharge?: Yes  Summary/Recommendations:   Summary and Recommendations (to be completed by the evaluator): Patient is a 79 year old male [facility] [place] [place] [facility]   He presents to the hospital following concerns for substance use and racing thoughts.  He has a primary diagnosis of Major Depressive Disorder.  Recommendations include: crisis stabilization, therapeutic milieu, encourage group attendance and participation, medication management for detox/mood stabilization and development of comprehensive mental wellness/sobriety plan.  Patrick Hardin. 05/10/2020

## 2020-05-10 NOTE — Progress Notes (Signed)
Pt is alert and oriented to person, place, time and situation. Pt is calm, cooperative, irritable at times. Pt refused breakfast reporting that he did not want to eat until his has a BM, c/o constipation. Pt was given scheduled meds and PRN meds for c/o of constipation, (see MAR), which were effective, pt reports about an hour later pt had a very large BM and feels a significant relief from constipation. Pt encouraged to drink prune juices at mealtimes. It is noted that there are some prune juices in the snack area on unit. Pt denies homicidal ideation, denies hallucinations, denies anxiety, reports feelings of depression rates it 8/10 on a 0-10 scale, 10 being worst. Pt reports intermittent passive SI (without plan), contracts verbally for safety, and agrees will come talk to staff if these thoughts change. Pt's affect is flat, pt isolates in his room, does note initiate conversation/interaction with staff or peers. Pt is medication complaint. Will continue to monitor pt per Q15 minute face checks and monitor for safety and progress.

## 2020-05-10 NOTE — Progress Notes (Signed)
Recreation Therapy Notes  INPATIENT RECREATION THERAPY ASSESSMENT  Patient Details Name: Patrick Hardin MRN: [id number] DOB: 1940/09/07 Today's Date: 05/10/2020       Information Obtained From: Patient  Able to Participate in Assessment/Interview: Yes  Patient Presentation: Responsive  Reason for Admission (Per Patient): Active Symptoms  Patient Stressors:    Coping Skills:   Write, Musician (2+):  [employer] - Travel (Comment), Music - Risk manager, Individual - Writing  Frequency of Recreation/Participation: Monthly  Awareness of Community Resources:     [employer]:     Current Use:    If no, Barriers?:    Expressed Interest in [employer] Information:    [place] of Residence:  Film/video editor  Patient Main Form of Transportation:    Patient Strengths:  Writing  Patient Identified Areas of Improvement:  Stay clean  Patient Goal for Hospitalization:  Clear my thoughts  Current SI (including self-harm):  Yes (No plan)  Current HI:  No  Current AVH: No  Staff Intervention Plan: Group Attendance, Collaborate with Interdisciplinary Treatment Team  Consent to Intern Participation: N/A  Hillard Goodwine 05/10/2020, 1:44 PM

## 2020-05-10 NOTE — [facility] Suicide Risk Assessment (Signed)
[facility] INPATIENT:  Family/Significant Other Suicide Prevention Education  Suicide Prevention Education:  Patient Refusal for Family/Significant Other Suicide Prevention Education: The patient Patrick Hardin has refused to provide written consent for family/significant other to be provided Family/Significant Other Suicide Prevention Education during admission and/or prior to discharge.  Physician notified.  SPE completed with pt, as pt refused to consent to family contact. SPI pamphlet provided to pt and pt was encouraged to share information with support network, ask questions, and talk about any concerns relating to SPE. Pt denies access to guns/firearms and verbalized understanding of information provided. Mobile Crisis information also provided to pt.   [name] 05/10/2020, 11:07 AM

## 2020-05-10 NOTE — Progress Notes (Signed)
Recreation Therapy Notes   Date: 05/10/2020  Time: 9:30 am   Location: Craft room     Behavioral response: N/A   Intervention Topic: Self-Care   Discussion/Intervention: Patient did not attend group.   Clinical Observations/Feedback:  Patient did not attend group.   Nehan Flaum LRT/CTRS        Coolidge Gossard 05/10/2020 11:20 AM

## 2020-05-10 NOTE — Progress Notes (Signed)
[facility] MD Progress Note  05/10/2020 3:47 PM Patrick Hardin  MRN:  [id number] Subjective: Follow-up 79 year old man with depression.  Patient seen chart reviewed.  He talked first of all about his bowel movements.  He went from constipated to now having several in a day.  Senokot seems to have helped.  I reassured him that this should level out now.  In the past he was using Metamucil and we can restart that.  Mood is still depressed although he does not seem as anxious or agitated.  Passive suicidal thoughts with no intent or plan.  Mostly staying withdrawn to his room. 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 Depression   . Diverticulosis   . Hypertension   . PUD (peptic ulcer disease)     Past Surgical History:  Procedure Laterality Date  . CARDIAC CATHETERIZATION  2006   Stenting at [facility]  . CATARACT EXTRACTION W/ INTRAOCULAR LENS IMPLANT    . CORONARY ANGIOPLASTY WITH STENT PLACEMENT  2006  . HYDROCELE EXCISION / REPAIR  2014   repair. [facility]  . TONSILLECTOMY AND ADENOIDECTOMY  1950   Family History:  Family History  Problem Relation Age of Onset  . Leukemia Father    Family Psychiatric  History: See previous Social History:  Social History   Substance and Sexual Activity  Alcohol Use No     Social History   Substance and Sexual Activity  Drug Use Yes  . Types: Marijuana   Comment: Acid    Social History   Socioeconomic History  . Marital status: Widowed    Spouse name: Not on file  . Number of children: 2  . Years of education: Not on file  . Highest education level: Not on file  Occupational History  . Not on file   Tobacco Use  . Smoking status: Former Smoker    Types: Cigarettes  . Smokeless tobacco: Never Used  . Tobacco comment: quit in 1997  Vaping Use  . Vaping Use: Never used  Substance and Sexual Activity  . Alcohol use: No  . Drug use: Yes    Types: Marijuana    Comment: Acid  . Sexual activity: Not on file  Other Topics Concern  . Not on file  Social History Narrative  . Not on file   Social Determinants of Health   Financial Resource Strain:   . Difficulty of Paying Living Expenses: Not on file  Food Insecurity:   . Worried About Programme researcher, broadcasting/film/video in the Last Year: Not on file  . Ran Out of Food in the Last Year: Not on file  Transportation Needs:   . Lack of Transportation (Medical): Not on file  . Lack of Transportation (Non-Medical): Not on file  Physical Activity:   . Days of Exercise per Week: Not on file  . Minutes of Exercise per Session: Not on file  Stress:   . Feeling of Stress : Not on file  Social Connections:   . Frequency of Communication with Friends and Family: Not on file  . Frequency of Social Gatherings with Friends and Family: Not on file  . Attends Religious Services: Not on file  .  Active Member of Clubs or Organizations: Not on file  . Attends Banker Meetings: Not on file  . Marital Status: Not on file   Additional Social History:                         Sleep: Fair  Appetite:  Fair  Current Medications: Current Facility-Administered Medications  Medication Dose Route Frequency Provider Last Rate Last Admin  . acetaminophen (TYLENOL) tablet 650 mg  650 mg Oral Q6H PRN Nira Conn A, NP      . alum & mag hydroxide-simeth (MAALOX/MYLANTA) 200-200-20 MG/5ML suspension 30 mL  30 mL Oral Q4H PRN Nira Conn A, NP      . aspirin EC tablet 81 mg  81 mg Oral Daily Nira Conn A, NP   81 mg at 05/10/20 0837  . atorvastatin (LIPITOR) tablet 40 mg  40 mg Oral q1800 Nira Conn A, NP   40 mg at 05/09/20 1720  . docusate  sodium (COLACE) capsule 100 mg  100 mg Oral BID Rayaan Lorah, Jackquline Denmark, MD   100 mg at 05/10/20 0837  . escitalopram (LEXAPRO) tablet 10 mg  10 mg Oral Daily Errika Narvaiz, Jackquline Denmark, MD   10 mg at 05/10/20 0837  . fluticasone (FLONASE) 50 MCG/ACT nasal spray 1 spray  1 spray Each Nare Daily Nira Conn A, NP   1 spray at 05/10/20 0837  . lisinopril (ZESTRIL) tablet 20 mg  20 mg Oral Daily Nira Conn A, NP   20 mg at 05/10/20 0837  . loratadine (CLARITIN) tablet 10 mg  10 mg Oral Daily Nira Conn A, NP   10 mg at 05/10/20 0837  . magnesium hydroxide (MILK OF MAGNESIA) suspension 30 mL  30 mL Oral Daily PRN Nira Conn A, NP   30 mL at 05/10/20 0837  . senna (SENOKOT) tablet 17.2 mg  2 tablet Oral Daily PRN Jolette Lana, Jackquline Denmark, MD   17.2 mg at 05/10/20 7062    Lab Results: No results found for this or any previous visit (from the past 48 hour(s)).  Blood Alcohol level:  Lab Results  Component Value Date   ETH <10 05/07/2020    Metabolic Disorder Labs: No results found for: HGBA1C, MPG No results found for: PROLACTIN No results found for: CHOL, TRIG, HDL, CHOLHDL, VLDL, LDLCALC  Physical Findings: AIMS:  , ,  ,  ,    CIWA:    COWS:     Musculoskeletal: Strength & Muscle Tone: within normal limits Gait & Station: normal Patient leans: N/A  Psychiatric Specialty Exam: Physical Exam Vitals and nursing note reviewed.  Constitutional:      Appearance: He is well-developed.  HENT:     Head: Normocephalic and atraumatic.  Eyes:     Conjunctiva/sclera: Conjunctivae normal.     Pupils: Pupils are equal, round, and reactive to light.  Cardiovascular:     Heart sounds: Normal heart sounds.  Pulmonary:     Effort: Pulmonary effort is normal.  Abdominal:     Palpations: Abdomen is soft.  Musculoskeletal:        General: Normal range of motion.     Cervical back: Normal range of motion.  Skin:    General: Skin is warm and dry.  Neurological:     General: No focal deficit present.     Mental  Status: He is alert.  Psychiatric:        Attention and Perception: Attention normal.  Mood and Affect: Mood is depressed.        Speech: Speech normal.        Behavior: Behavior is slowed.        Thought Content: Thought content includes suicidal ideation. Thought content does not include suicidal plan.        Cognition and Memory: Cognition normal.        Judgment: Judgment normal.     Review of Systems  Constitutional: Negative.   HENT: Negative.   Eyes: Negative.   Respiratory: Negative.   Cardiovascular: Negative.   Gastrointestinal: Negative.   Musculoskeletal: Negative.   Skin: Negative.   Neurological: Negative.   Psychiatric/Behavioral: Positive for dysphoric mood.    Blood pressure (!) 159/85, pulse 82, temperature 98 F (36.7 C), temperature source Oral, resp. rate 17, height 6' (1.829 m), weight 90.7 kg, SpO2 96 %.Body mass index is 27.12 kg/m.  General Appearance: Casual  Eye Contact:  Minimal  Speech:  Slow  Volume:  Decreased  Mood:  Depressed  Affect:  Congruent  Thought Process:  Coherent  Orientation:  Full (Time, Place, and Person)  Thought Content:  Logical  Suicidal Thoughts:  Yes.  without intent/plan  Homicidal Thoughts:  No  Memory:  Immediate;   Fair Recent;   Fair Remote;   Fair  Judgement:  Fair  Insight:  Fair  Psychomotor Activity:  Normal  Concentration:  Concentration: Fair  Recall:  [employer] of Knowledge:  Fair  Language:  Fair  Akathisia:  No  Handed:  Right  AIMS (if indicated):     Assets:  Desire for Improvement Housing Physical Health  ADL's:  Impaired  Cognition:  Impaired,  Mild  Sleep:  Number of Hours: 7     Treatment Plan Summary: Daily contact with patient to assess and evaluate symptoms and progress in treatment, Medication management and Plan Patient will continue on current medicine including antidepressant medicine.  Encourage him to get up out of bed and interact and attend groups.  Ongoing  reassessment of dangerousness prior to arranging for discharge.  He is already working with social work on referral to the [place] system  Mordecai Rasmussen, MD 05/10/2020, 3:47 PM

## 2020-05-11 MED ORDER — TRAZODONE HCL 50 MG PO TABS
50.0000 mg | ORAL_TABLET | Freq: Every evening | ORAL | Status: DC | PRN
  Administered 2020-05-11: 50 mg via ORAL
  Filled 2020-05-11: qty 1

## 2020-05-11 MED ORDER — FLUTICASONE PROPIONATE 50 MCG/ACT NA SUSP
1.0000 | Freq: Two times a day (BID) | NASAL | Status: DC
  Administered 2020-05-11 – 2020-05-17 (×12): 1 via NASAL
  Filled 2020-05-11: qty 16

## 2020-05-11 NOTE — Progress Notes (Signed)
   05/11/20 0900  Psych Admission Type (Psych Patients Only)  Admission Status Involuntary  Psychosocial Assessment  Patient Complaints Anxiety;Depression  Eye Contact Brief  Facial Expression Flat  Affect Anxious;Preoccupied;Sad  Speech Logical/coherent  Interaction Assertive  Motor Activity Slow  Appearance/Hygiene Unremarkable  Behavior Characteristics Cooperative;Appropriate to situation  Mood Anxious;Preoccupied  Thought Process  Coherency WDL  Content Preoccupation  Delusions None reported or observed  Perception WDL  Hallucination None reported or observed  Judgment Limited  Confusion None  Danger to Self  Current suicidal ideation? Denies  Danger to Others  Danger to Others None reported or observed    05/11/20 1056  COVID-19 Daily Checkoff  Have you had a fever (temp > 37.80C/100F)  in the past 24 hours?  No  If you have had runny nose, nasal congestion, sneezing in the past 24 hours, has it worsened? No  COVID-19 EXPOSURE  Have you traveled outside the state in the past 14 days? No  Have you been in contact with someone with a confirmed diagnosis of COVID-19 or PUI in the past 14 days without wearing appropriate PPE? No  Have you been living in the same home as a person with confirmed diagnosis of COVID-19 or a PUI (household contact)? No  Have you been diagnosed with COVID-19? No

## 2020-05-11 NOTE — [facility] Group Notes (Signed)
[facility] Group Notes:  (Nursing/MHT/Case Management/Adjunct)  Date:  05/11/2020  Time:  10:58 PM  Type of Therapy:  Group Therapy  Participation Level:  Active  Participation Quality:  Appropriate  Affect:  Appropriate  Cognitive:  Alert  Insight:  Good  Engagement in Group:  Engaged and goal is to get some ph call done.  Modes of Intervention:  Activity  Summary of Progress/Problems:  Patrick Hardin 05/11/2020, 10:58 PM

## 2020-05-11 NOTE — [facility] Group Notes (Signed)
[facility] Group Notes: (Clinical Social Work)   05/11/2020      Type of Therapy:  Group Therapy   Participation Level:  Did Not Attend - was invited individually by Nurse/MHT and chose not to attend.   Susa Simmonds, LCSWA 05/11/2020  5:06 PM

## 2020-05-11 NOTE — Plan of Care (Signed)
Pt mostly reclusive to his room and out for meds and snacks. He presents anxious and endorses passive SI without a plan. He was mostly preoccupied with his medications especially his nasal spray which he reports he is supposed to get twice a day "or I'll die in the middle of the night for lack of breathing." Reassurance provided and a list of medications was provided with the administration time to help with his understanding. Vet was able to relax after he was offered an extra pillow and HOB was raised to his liking. No falls or unsafe behavior noted thus far. He is currently resting in bed with + even and unlabored respirations.  Q15 minutes observations maintained for safety and support provided as needed.   Problem: Education: Goal: Knowledge of  General Education information/materials will improve Outcome: Progressing Goal: Emotional status will improve Outcome: Progressing Goal: Mental status will improve Outcome: Progressing Goal: Verbalization of understanding the information provided will improve Outcome: Progressing   Problem: Activity: Goal: Sleeping patterns will improve Outcome: Progressing   Problem: Coping: Goal: Ability to verbalize frustrations and anger appropriately will improve Outcome: Progressing Goal: Ability to demonstrate self-control will improve Outcome: Progressing

## 2020-05-11 NOTE — Progress Notes (Signed)
[facility] MD Progress Note  05/11/2020 1:21 PM [name]  MRN:  [id number]  Principal Problem: Severe recurrent major depression (HCC) Diagnosis: Principal Problem:   Severe recurrent major depression (HCC) Active Problems:   Hypertension   Cannabis abuse  Total Time spent with patient: 20 minutes  Mr. Patrick Hardin is a  79y.o. male that has a previous psychiatric history of depression who presents to the [facility] unit for suicidal ideations.   Interval History Patient was seen today for re-evaluation.  Nursing reports no events overnight. The patient reports no issues with performing ADLs.  Patient has been medication compliant.  The patient reports no side effects from medications.    SUBJECTIVE: On assessment patient denies feeling acutely-suicidal, although continues to express passive death wishes in settings of feeling lonely, "feel like I am burden for myself", having no family, nothing to live for. Reports he slept better last night. He is concerned that his nasal spray not giving to him as prior to the hospital - he asks to schedule it twice daily; states he cannot sleep if not taking the spray at bedtime. Reports he is sober from drinking for 26 years and is proud about that.    Current suicidal/homicidal ideations: Denies Current auditory/visual hallucinations: Denies  Labs: no new for review     Past Psychiatric History: see H&P  Past Medical History:  Past Medical History:  Diagnosis Date  . Depression   . Diverticulosis   . Hypertension   . PUD (peptic ulcer disease)     Past Surgical History:  Procedure Laterality Date  . CARDIAC CATHETERIZATION  2006   Stenting at [facility]  . CATARACT EXTRACTION W/ INTRAOCULAR LENS IMPLANT    . CORONARY ANGIOPLASTY WITH STENT PLACEMENT  2006  . HYDROCELE EXCISION / REPAIR  2014   repair. [facility]  . TONSILLECTOMY AND ADENOIDECTOMY  1950   Family History:  Family History  Problem Relation Age of Onset  . Leukemia Father    Family  Psychiatric  History: see H&P  Social History:  Social History   Substance and Sexual Activity  Alcohol Use No     Social History   Substance and Sexual Activity  Drug Use Yes  . Types: Marijuana   Comment: Acid    Social History   Socioeconomic History  . Marital status: Widowed    Spouse name: Not on file  . Number of children: 2  . Years of education: Not on file  . Highest education level: Not on file  Occupational History  . Not on file  Tobacco Use  . Smoking status: Former Smoker    Types: Cigarettes  . Smokeless tobacco: Never Used  . Tobacco comment: quit in 1997  Vaping Use  . Vaping Use: Never used  Substance and Sexual Activity  . Alcohol use: No  . Drug use: Yes    Types: Marijuana    Comment: Acid  . Sexual activity: Not on file  Other Topics Concern  . Not on file  Social History Narrative  . Not on file   Social Determinants of Health   Financial Resource Strain:   . Difficulty of Paying Living Expenses: Not on file  Food Insecurity:   . Worried About Programme researcher, broadcasting/film/video in the Last Year: Not on file  . Ran Out of Food in the Last Year: Not on file  Transportation Needs:   . Lack of Transportation (Medical): Not on file  . Lack of Transportation (Non-Medical): Not on  file  Physical Activity:   . Days of Exercise per Week: Not on file  . Minutes of Exercise per Session: Not on file  Stress:   . Feeling of Stress : Not on file  Social Connections:   . Frequency of Communication with Friends and Family: Not on file  . Frequency of Social Gatherings with Friends and Family: Not on file  . Attends Religious Services: Not on file  . Active Member of Clubs or Organizations: Not on file  . Attends Banker Meetings: Not on file  . Marital Status: Not on file   Additional Social History:                         Sleep: Fair  Appetite:  Fair  Current Medications: Current Facility-Administered Medications  Medication  Dose Route Frequency Provider Last Rate Last Admin  . acetaminophen (TYLENOL) tablet 650 mg  650 mg Oral Q6H PRN Nira Conn A, NP      . alum & mag hydroxide-simeth (MAALOX/MYLANTA) 200-200-20 MG/5ML suspension 30 mL  30 mL Oral Q4H PRN Nira Conn A, NP      . aspirin EC tablet 81 mg  81 mg Oral Daily Nira Conn A, NP   81 mg at 05/11/20 0831  . atorvastatin (LIPITOR) tablet 40 mg  40 mg Oral q1800 Nira Conn A, NP   40 mg at 05/10/20 1652  . docusate sodium (COLACE) capsule 100 mg  100 mg Oral BID Clapacs, Jackquline Denmark, MD   100 mg at 05/11/20 0834  . escitalopram (LEXAPRO) tablet 10 mg  10 mg Oral Daily Clapacs, Jackquline Denmark, MD   10 mg at 05/11/20 0831  . fluticasone (FLONASE) 50 MCG/ACT nasal spray 1 spray  1 spray Each Nare BID Thuy Atilano, MD      . hydroxypropyl methylcellulose / hypromellose (ISOPTO TEARS / GONIOVISC) 2.5 % ophthalmic solution 2 drop  2 drop Both Eyes PRN Clapacs, Zyshonne T, MD      . lisinopril (ZESTRIL) tablet 20 mg  20 mg Oral Daily Nira Conn A, NP   20 mg at 05/11/20 0831  . loratadine (CLARITIN) tablet 10 mg  10 mg Oral Daily Nira Conn A, NP   10 mg at 05/11/20 0831  . magnesium hydroxide (MILK OF MAGNESIA) suspension 30 mL  30 mL Oral Daily PRN Nira Conn A, NP   30 mL at 05/10/20 0837  . psyllium (HYDROCIL/METAMUCIL) 1 packet  1 packet Oral Daily Clapacs, Jackquline Denmark, MD   1 packet at 05/11/20 [phone]  . senna (SENOKOT) tablet 17.2 mg  2 tablet Oral Daily PRN Clapacs, Jackquline Denmark, MD   17.2 mg at 05/11/20 0831    Lab Results: No results found for this or any previous visit (from the past 48 hour(s)).  Blood Alcohol level:  Lab Results  Component Value Date   ETH <10 05/07/2020    Metabolic Disorder Labs: No results found for: HGBA1C, MPG No results found for: PROLACTIN No results found for: CHOL, TRIG, HDL, CHOLHDL, VLDL, LDLCALC  Physical Findings: AIMS:  , ,  ,  ,    CIWA:    COWS:     Musculoskeletal: Strength & Muscle Tone: within normal limits Gait &  Station: normal Patient leans: N/A  Psychiatric Specialty Exam: Physical Exam  Review of Systems  Blood pressure (!) 151/82, pulse 72, temperature 98.6 F (37 C), temperature source Oral, resp. rate 18, height 6' (1.829 m), weight  90.7 kg, SpO2 96 %.Body mass index is 27.12 kg/m.  General Appearance: Casual  Eye Contact:  Fair  Speech:  Slow  Volume:  Decreased  Mood:  Depressed  Affect:  Congruent and Constricted  Thought Process:  Coherent and Goal Directed  Orientation:  Full (Time, Place, and Person)  Thought Content:  Logical  Suicidal Thoughts:  no active SI. Reports passive death wishes without plan.  Homicidal Thoughts:  No  Memory:  Immediate;   Fair  Judgement:  Fair  Insight:  Fair  Psychomotor Activity:  Normal  Concentration:  Concentration: Fair  Recall:  [employer] of Knowledge:  Fair  Language:  Fair  Akathisia:  No  Handed:  Right  AIMS (if indicated):     Assets:  Communication Skills Desire for Improvement Housing  ADL's:  Intact  Cognition:  WNL  Sleep:  Number of Hours: 7     Treatment Plan Summary: Daily contact with patient to assess and evaluate symptoms and progress in treatment and Medication management   Patient is a 79 year old male with the above-stated past psychiatric history who is seen in follow-up.  Chart reviewed. Patient discussed with nursing. Patient continues to express depressed mood and passive suicidal thoughts without plan. Patient will continue on current medicine including antidepressant medicine.  Encourage him to get up out of bed and interact and attend groups.     Plan:  -continue inpatient psych admission; 15-minute checks; daily contact with patient to assess and evaluate symptoms and progress in treatment; psychoeducation.  -continue scheduled psych medications: . aspirin EC  81 mg Oral Daily  . atorvastatin  40 mg Oral q1800  . docusate sodium  100 mg Oral BID  . escitalopram  10 mg Oral Daily  . fluticasone   1 spray Each Nare BID  . lisinopril  20 mg Oral Daily  . loratadine  10 mg Oral Daily  . psyllium  1 packet Oral Daily    -continue PRN medications.  acetaminophen, alum & mag hydroxide-simeth, hydroxypropyl methylcellulose / hypromellose, magnesium hydroxide, senna  -Pertinent Labs: no new labs ordered today    -Consults: No new consults placed since yesterday    -Disposition: Estimated duration of hospitalization: midweek next week. Social worker working with patient on referral to the [place] system   -  I certify that the patient does need, on a daily basis, active treatment furnished directly by or requiring the supervision of inpatient psychiatric facility personnel.  For inpatient care, physician will direct treatment team plan within three days of admission and at least weekly thereafter.     Thalia Party, MD 05/11/2020, 1:21 PM

## 2020-05-12 MED ORDER — TRAZODONE HCL 50 MG PO TABS
75.0000 mg | ORAL_TABLET | Freq: Every evening | ORAL | Status: DC | PRN
  Administered 2020-05-12 – 2020-05-16 (×5): 75 mg via ORAL
  Filled 2020-05-12 (×5): qty 2

## 2020-05-12 NOTE — Plan of Care (Signed)
  Problem: Education: Goal: Knowledge of [facility] Education information/materials will improve Outcome: Progressing Goal: Emotional status will improve Outcome: Progressing Goal: Mental status will improve Outcome: Progressing Goal: Verbalization of understanding the information provided will improve Outcome: Progressing   Problem: Activity: Goal: Interest or engagement in activities will improve Outcome: Progressing Goal: Sleeping patterns will improve Outcome: Progressing   

## 2020-05-12 NOTE — Progress Notes (Signed)
Patient has been calm and cooperative. Denies SI, HI and AVH. Complained of insomnia. Requested and received Trazodone 50 mg for sleep.

## 2020-05-12 NOTE — Progress Notes (Signed)
Patient informed this Clinical research associate that the Prune juice was effective.

## 2020-05-12 NOTE — [facility] Group Notes (Signed)
[facility] Group Notes: (Clinical Social Work)   05/12/2020      Type of Therapy:  Group Therapy   Participation Level:  Did Not Attend - was invited individually by Nurse/MHT and chose not to attend.   Susa Simmonds, LCSWA 05/12/2020  3:08 PM

## 2020-05-12 NOTE — Progress Notes (Signed)
[facility] MD Progress Note  05/12/2020 9:42 AM Patrick Hardin  MRN:  [id number]  Principal Problem: Severe recurrent major depression (HCC) Diagnosis: Principal Problem:   Severe recurrent major depression (HCC) Active Problems:   Hypertension   Cannabis abuse  Total Time spent with patient: 20 minutes  Patrick Hardin is a  79y.o. male that has a previous psychiatric history of depression who presents to the [facility] unit for suicidal ideations.   Interval History Patient was seen today for re-evaluation.  Nursing reports no events overnight. The patient reports no issues with performing ADLs.  Patient has been medication compliant.  The patient reports no side effects from medications.    SUBJECTIVE: On assessment patient reports he did not sleep well last night and requests to increase the doser of Trazodone. His last bowel movement was two days ago. He describes his mood as "depressed, but it`s getting better". He is not acutely-suicidal. He is making plans for future - reports he called his old sponsor yesterday and asked him to restart meetings with him and reconnect him with AA.  He is sober from drinking for 26 years, although relapsed on Marijuana 8 months ago and was using almost daily since; thinks it made him more depressed and paranoid about some sounds at home. Reports he called his brother last night and his plan is to spend more time with family after discharge. No new mental or physical complaints today. No side effects from medications.  Current suicidal/homicidal ideations: Denies Current auditory/visual hallucinations: Denies  Labs: no new for review     Past Psychiatric History: see H&P  Past Medical History:  Past Medical History:  Diagnosis Date  . Depression   . Diverticulosis   . Hypertension   . PUD (peptic ulcer disease)     Past Surgical History:  Procedure Laterality Date  . CARDIAC CATHETERIZATION  2006   [facility] at [facility]  . CATARACT EXTRACTION W/ INTRAOCULAR LENS  IMPLANT    . CORONARY ANGIOPLASTY WITH STENT PLACEMENT  2006  . HYDROCELE EXCISION / REPAIR  2014   repair. [facility]  . TONSILLECTOMY AND ADENOIDECTOMY  1950   Family History:  Family History  Problem Relation Age of Onset  . Leukemia Father    Family Psychiatric  History: see H&P  Social History:  Social History   Substance and Sexual Activity  Alcohol Use No     Social History   Substance and Sexual Activity  Drug Use Yes  . Types: Marijuana   Comment: Acid    Social History   Socioeconomic History  . Marital status: Widowed    Spouse name: Not on file  . Number of children: 2  . Years of education: Not on file  . Highest education level: Not on file  Occupational History  . Not on file  Tobacco Use  . Smoking status: Former Smoker    Types: Cigarettes  . Smokeless tobacco: Never Used  . Tobacco comment: quit in 1997  Vaping Use  . Vaping Use: Never used  Substance and Sexual Activity  . Alcohol use: No  . Drug use: Yes    Types: Marijuana    Comment: Acid  . Sexual activity: Not on file  Other Topics Concern  . Not on file  Social History Narrative  . Not on file   Social Determinants of Health   Financial Resource Strain:   . Difficulty of Paying Living Expenses: Not on file  Food Insecurity:   . Worried About  Running Out of Food in the Last Year: Not on file  . Ran Out of Food in the Last Year: Not on file  Transportation Needs:   . Lack of Transportation (Medical): Not on file  . Lack of Transportation (Non-Medical): Not on file  Physical Activity:   . Days of Exercise per Week: Not on file  . Minutes of Exercise per Session: Not on file  Stress:   . Feeling of Stress : Not on file  Social Connections:   . Frequency of Communication with Friends and Family: Not on file  . Frequency of Social Gatherings with Friends and Family: Not on file  . Attends Religious Services: Not on file  . Active Member of Clubs or Organizations: Not on file  .  Attends Banker Meetings: Not on file  . Marital Status: Not on file   Additional Social History:                         Sleep: Fair  Appetite:  Fair  Current Medications: Current Facility-Administered Medications  Medication Dose Route Frequency Provider Last Rate Last Admin  . acetaminophen (TYLENOL) tablet 650 mg  650 mg Oral Q6H PRN Nira Conn A, NP      . alum & mag hydroxide-simeth (MAALOX/MYLANTA) 200-200-20 MG/5ML suspension 30 mL  30 mL Oral Q4H PRN Nira Conn A, NP      . aspirin EC tablet 81 mg  81 mg Oral Daily Nira Conn A, NP   81 mg at 05/12/20 0811  . atorvastatin (LIPITOR) tablet 40 mg  40 mg Oral q1800 Nira Conn A, NP   40 mg at 05/11/20 1723  . docusate sodium (COLACE) capsule 100 mg  100 mg Oral BID Clapacs, Jackquline Denmark, MD   100 mg at 05/12/20 0811  . escitalopram (LEXAPRO) tablet 10 mg  10 mg Oral Daily Clapacs, Jackquline Denmark, MD   10 mg at 05/12/20 0811  . fluticasone (FLONASE) 50 MCG/ACT nasal spray 1 spray  1 spray Each Nare BID Thalia Party, MD   1 spray at 05/12/20 0811  . hydroxypropyl methylcellulose / hypromellose (ISOPTO TEARS / GONIOVISC) 2.5 % ophthalmic solution 2 drop  2 drop Both Eyes PRN Clapacs, Noland T, MD      . lisinopril (ZESTRIL) tablet 20 mg  20 mg Oral Daily Nira Conn A, NP   20 mg at 05/12/20 0811  . loratadine (CLARITIN) tablet 10 mg  10 mg Oral Daily Nira Conn A, NP   10 mg at 05/12/20 0811  . magnesium hydroxide (MILK OF MAGNESIA) suspension 30 mL  30 mL Oral Daily PRN Nira Conn A, NP   30 mL at 05/10/20 0837  . psyllium (HYDROCIL/METAMUCIL) 1 packet  1 packet Oral Daily Clapacs, Jackquline Denmark, MD   1 packet at 05/12/20 [phone]  . senna (SENOKOT) tablet 17.2 mg  2 tablet Oral Daily PRN Clapacs, Jackquline Denmark, MD   17.2 mg at 05/11/20 0831  . traZODone (DESYREL) tablet 75 mg  75 mg Oral QHS PRN Thalia Party, MD        Lab Results: No results found for this or any previous visit (from the past 48 hour(s)).  Blood Alcohol level:   Lab Results  Component Value Date   ETH <10 05/07/2020    Metabolic Disorder Labs: No results found for: HGBA1C, MPG No results found for: PROLACTIN No results found for: CHOL, TRIG, HDL, CHOLHDL, VLDL, LDLCALC  Physical Findings:  AIMS:  , ,  ,  ,    CIWA:    COWS:     Musculoskeletal: Strength & Muscle Tone: within normal limits Gait & Station: normal Patient leans: N/A  Psychiatric Specialty Exam: Physical Exam   Review of Systems   Blood pressure (!) 145/82, pulse 69, temperature 98.1 F (36.7 C), temperature source Oral, resp. rate 18, height 6' (1.829 m), weight 90.7 kg, SpO2 95 %.Body mass index is 27.12 kg/m.  General Appearance: Casual  Eye Contact:  Fair  Speech:  Slow  Volume:  Decreased  Mood:  Depressed  Affect:  Congruent and Constricted  Thought Process:  Coherent and Goal Directed  Orientation:  Full (Time, Place, and Person)  Thought Content:  Logical  Suicidal Thoughts:  no active SI. Reports passive death wishes without plan.  Homicidal Thoughts:  No  Memory:  Immediate;   Fair  Judgement:  Fair  Insight:  Fair  Psychomotor Activity:  Normal  Concentration:  Concentration: Fair  Recall:  [employer] of Knowledge:  Fair  Language:  Fair  Akathisia:  No  Handed:  Right  AIMS (if indicated):     Assets:  Communication Skills Desire for Improvement Housing  ADL's:  Intact  Cognition:  WNL  Sleep:  Number of Hours: 6     Treatment Plan Summary: Daily contact with patient to assess and evaluate symptoms and progress in treatment and Medication management   Patient is a 79 year old male with the above-stated past psychiatric history who is seen in follow-up.  Chart reviewed. Patient discussed with nursing. Patient continues to express depressed mood, denies suicidal thoughts today. Patient will continue on current medicine including antidepressant medicine (lexapro 10mg ).  Encourage him to get up out of bed and interact and attend groups. The  dose of Trazodone slightly increased for tonight.     Plan:  -continue inpatient psych admission; 15-minute checks; daily contact with patient to assess and evaluate symptoms and progress in treatment; psychoeducation.  -continue scheduled medications: . aspirin EC  81 mg Oral Daily  . atorvastatin  40 mg Oral q1800  . docusate sodium  100 mg Oral BID  . escitalopram  10 mg Oral Daily  . fluticasone  1 spray Each Nare BID  . lisinopril  20 mg Oral Daily  . loratadine  10 mg Oral Daily  . psyllium  1 packet Oral Daily    -continue PRN medications.  acetaminophen, alum & mag hydroxide-simeth, hydroxypropyl methylcellulose / hypromellose, magnesium hydroxide, senna, traZODone  -Pertinent Labs: no new labs ordered today    -Consults: No new consults placed since yesterday    -Disposition: Estimated duration of hospitalization: midweek next week. Social worker working with patient on referral to the system   -  I certify that the patient does need, on a daily basis, active treatment furnished directly by or requiring the supervision of inpatient psychiatric facility personnel.  For inpatient care, physician will direct treatment team plan within three days of admission and at least weekly thereafter.     [place], MD 05/12/2020, 9:42 AM

## 2020-05-12 NOTE — Progress Notes (Signed)
Patient is continuing to complain about not having a bowel movement for two days, since 05/10/2020. This Clinical research associate gave him some Prune juice to see if he would have some relief.

## 2020-05-12 NOTE — Plan of Care (Signed)
D- Patient alert and oriented. Patient presented in a depressed, but pleasant mood on assessment stating that he slept fair last night, and had complaints of not having a bowel movement in two days. Patient endorsed passive SI, as well as, both depression and anxiety. Patient stated that he has suicidal thoughts "every once in a while. I just want to get out of it". Patient also stated "I look into the light, but I ain't in it. I was self medicating with marijuana, but I haven't used in about six to eight months". This is contradictory to his positive urine drug screen for Cannabinoid two days prior to admission. Patient stated that he is anxious about "I'm not going to take a crap", and being started on multiple medications that have yet to provide him with any relief at this point. Patient did contract for safety. Patient denied HI/AVH and pain to this Clinical research associate. Patient's goal for today is to "keep on getting clean", in which he will do "what I can", in order to achieve his goal.  A- Scheduled medications administered to patient, per MD orders. Support and encouragement provided.  Routine safety checks conducted every 15 minutes.  Patient informed to notify staff with problems or concerns.  R- No adverse drug reactions noted. Patient contracts for safety at this time. Patient compliant with medications. Patient receptive, calm, and cooperative. Patient interacts well with others on the unit.  Patient remains safe at this time.  Problem: Education: Goal: Knowledge of [facility] Education information/materials will improve Outcome: Not Progressing Goal: Emotional status will improve Outcome: Not Progressing Goal: Mental status will improve Outcome: Not Progressing Goal: Verbalization of understanding the information provided will improve Outcome: Not Progressing   Problem: Activity: Goal: Interest or engagement in activities will improve Outcome: Not Progressing Goal: Sleeping patterns will  improve Outcome: Not Progressing   Problem: Coping: Goal: Ability to verbalize frustrations and anger appropriately will improve Outcome: Not Progressing Goal: Ability to demonstrate self-control will improve Outcome: Not Progressing   Problem: Health Behavior/Discharge Planning: Goal: Identification of resources available to assist in meeting health care needs will improve Outcome: Not Progressing Goal: Compliance with treatment plan for underlying cause of condition will improve Outcome: Not Progressing   Problem: Physical Regulation: Goal: Ability to maintain clinical measurements within normal limits will improve Outcome: Not Progressing   Problem: Safety: Goal: Periods of time without injury will increase Outcome: Not Progressing   Problem: Education: Goal: Knowledge of disease or condition will improve Outcome: Not Progressing Goal: Understanding of discharge needs will improve Outcome: Not Progressing   Problem: Health Behavior/Discharge Planning: Goal: Ability to identify changes in lifestyle to reduce recurrence of condition will improve Outcome: Not Progressing Goal: Identification of resources available to assist in meeting health care needs will improve Outcome: Not Progressing   Problem: Physical Regulation: Goal: Complications related to the disease process, condition or treatment will be avoided or minimized Outcome: Not Progressing   Problem: Safety: Goal: Ability to remain free from injury will improve Outcome: Not Progressing   Problem: Education: Goal: Will be free of psychotic symptoms Outcome: Not Progressing Goal: Knowledge of the prescribed therapeutic regimen will improve Outcome: Not Progressing   Problem: Role Relationship: Goal: Ability to communicate needs accurately will improve Outcome: Not Progressing

## 2020-05-13 NOTE — Plan of Care (Signed)
Patient endorses passive SI, anxiety, and depression.   Problem: Education: Goal: Emotional status will improve 05/13/2020 2013 by Elmyra Ricks, RN Outcome: Not Progressing Goal: Mental status will improve 05/13/2020 2013 by Elmyra Ricks, RN Outcome: Not Progressing

## 2020-05-13 NOTE — [facility] Group Notes (Signed)
  [facility] LCSW Group Therapy Note  Date/Time:  05/13/2020 1:15PM  Type of Therapy and Topic:  Group Therapy:  Feelings About Hospitalization  Participation Level:  Active   Description of Group This process group involved patients discussing their feelings related to being hospitalized, as well as the benefits they see to being in the hospital.  These feelings and benefits were itemized.  The group then brainstormed specific ways in which they could seek those same benefits when they discharge and return home.  Therapeutic Goals Patient will identify and describe positive and negative feelings related to hospitalization Patient will verbalize benefits of hospitalization to themselves personally Patients will brainstorm together ways they can obtain similar benefits in the outpatient setting, identify barriers to wellness and possible solutions  Summary of Patient Progress:  The patient actively engaged in introductory check-in, sharing his name and daily scaling of 6/10, detailing due to "Being here the last week". Pt expressed his primary feelings about being hospitalized are "hopeful and fearful", explaining he is hopeful to maintain stability but also fearful of not being secure. Pt actively engaged in discussion, identifying positives and benefits of being hospitalized to be having all health needs met, medical care included, as well as healthy food being provided and being monitored on medication. Pt identified inpatient admission to be "like regimentation" which can be "confusing and frustrating". Pt proved to continue discussion of how to secure benefits in a outpatient setting. Pt proved receptive to alternate group members input and feedback from [place].  Therapeutic Modalities Cognitive Behavioral Therapy Motivational Interviewing    Blane Ohara, LCSW 05/13/2020  2:35 PM

## 2020-05-13 NOTE — Progress Notes (Signed)
Patient has been cooperative. Mood is anxious and depressed. Affect is appropriate for circumstance. Denies SI, HI and AVH

## 2020-05-13 NOTE — [facility] Group Notes (Signed)
[facility] Group Notes:  (Nursing/MHT/Case Management/Adjunct)  Date:  05/13/2020  Time:  5:20 PM  Type of Therapy:  Psychoeducational Skills  Participation Level:  Minimal  Participation Quality:  Attentive  Affect:  Flat  Cognitive:  Oriented  Insight:  Improving  Engagement in Group:  Improving  Modes of Intervention:  Education  Summary of Progress/Problems:  Clint Guy 05/13/2020, 5:20 PM

## 2020-05-13 NOTE — Progress Notes (Signed)
Recreation Therapy Notes  Date: 05/13/2020  Time: 9:30 am   Location: Craft room  Behavioral response: Appropriate  Intervention Topic: Self-esteem   Discussion/Intervention:  Group content today was focused on self-esteem. Patient defined self-esteem and where it comes form. The group described reasons self-esteem is important. Individuals stated things that impact self-esteem and positive ways to improve self-esteem. The group participated in the intervention "Collage of Me" where patients were able to create a collage of positive things that makes them who they are.  Clinical Observations/Feedback: Patient came to group and expressed that sometime other people can affect your self-esteem. He explained that he could improve his self-esteem by getting a good shave. Individual was social with peers and staff while participating in the intervention.  Chandani Rogowski LRT/CTRS         Orlo Brickle 05/13/2020 12:15 PM

## 2020-05-13 NOTE — Plan of Care (Signed)
  Problem: Education: Goal: Knowledge of [place] [facility] Education information/materials will improve Outcome: Progressing Goal: Emotional status will improve Outcome: Progressing Goal: Mental status will improve Outcome: Progressing Goal: Verbalization of understanding the information provided will improve Outcome: Progressing   Problem: Activity: Goal: Interest or engagement in activities will improve Outcome: Progressing Goal: Sleeping patterns will improve Outcome: Progressing   

## 2020-05-13 NOTE — Progress Notes (Signed)
Patient calm and pleasant during assessment denying SI/HI/AVH, pain. Patient endorses anxiety and depression. Patient compliant with medication administration per MD orders. Patient isolative to his room and minimal with staff and peers. Patient being monitored Q 15 minutes for safety per unit protocol. Pt remains safe on the unit.

## 2020-05-13 NOTE — [facility] Group Notes (Signed)
[facility] Group Notes:  (Nursing/MHT/Case Management/Adjunct)  Date:  05/13/2020  Time:  9:44 AM  Type of Therapy:  Community Meeting  Participation Level:  Active  Participation Quality:  Appropriate, Attentive and Sharing  Affect:  Appropriate  Cognitive:  Alert and Appropriate  Insight:  Appropriate  Engagement in Group:  Engaged  Modes of Intervention:  Discussion, Education and Support  Summary of Progress/Problems:  Patrick Hardin [facility] 05/13/2020, 9:44 AM

## 2020-05-13 NOTE — Progress Notes (Signed)
[facility] MD Progress Note  05/13/2020 5:15 PM Patrick Hardin  MRN:  [id number] Subjective: Patient seen chart reviewed.  79 year old man with depression.  Patient continues to feel quite depressed.  He is filled with ruminative self negativity.  We talked for quite a while about a lot of negative cognitions he is having most of which I think are pretty unrealistic but of course are upsetting to him.  He is going to some groups.  He is tolerating medication.  He still has suicidal thoughts but without any specific intent or plan.  No specific physical complaints Principal Problem: Severe recurrent major depression (HCC) Diagnosis: Principal Problem:   Severe recurrent major depression (HCC) Active Problems:   Hypertension   Cannabis abuse  Total Time spent with patient: 30 minutes  Past Psychiatric History: Past history of longstanding substance abuse  Past Medical History:  Past Medical History:  Diagnosis Date  . Depression   . Diverticulosis   . Hypertension   . PUD (peptic ulcer disease)     Past Surgical History:  Procedure Laterality Date  . CARDIAC CATHETERIZATION  2006   Stenting at [facility]  . CATARACT EXTRACTION W/ INTRAOCULAR LENS IMPLANT    . CORONARY ANGIOPLASTY WITH STENT PLACEMENT  2006  . HYDROCELE EXCISION / REPAIR  2014   repair. [facility]  . TONSILLECTOMY AND ADENOIDECTOMY  1950   Family History:  Family History  Problem Relation Age of Onset  . Leukemia Father    Family Psychiatric  History: See previous Social History:  Social History   Substance and Sexual Activity  Alcohol Use No     Social History   Substance and Sexual Activity  Drug Use Yes  . Types: Marijuana   Comment: Acid    Social History   Socioeconomic History  . Marital status: Widowed    Spouse name: Not on file  . Number of children: 2  . Years of education: Not on file  . Highest education level: Not on file  Occupational History  . Not on file  Tobacco Use  . Smoking status: Former  Smoker    Types: Cigarettes  . Smokeless tobacco: Never Used  . Tobacco comment: quit in 1997  Vaping Use  . Vaping Use: Never used  Substance and Sexual Activity  . Alcohol use: No  . Drug use: Yes    Types: Marijuana    Comment: Acid  . Sexual activity: Not on file  Other Topics Concern  . Not on file  Social History Narrative  . Not on file   Social Determinants of Health   Financial Resource Strain:   . Difficulty of Paying Living Expenses: Not on file  Food Insecurity:   . Worried About Programme researcher, broadcasting/film/video in the Last Year: Not on file  . Ran Out of Food in the Last Year: Not on file  Transportation Needs:   . Lack of Transportation (Medical): Not on file  . Lack of Transportation (Non-Medical): Not on file  Physical Activity:   . Days of Exercise per Week: Not on file  . Minutes of Exercise per Session: Not on file  Stress:   . Feeling of Stress : Not on file  Social Connections:   . Frequency of Communication with Friends and Family: Not on file  . Frequency of Social Gatherings with Friends and Family: Not on file  . Attends Religious Services: Not on file  . Active Member of Clubs or Organizations: Not on file  .  Attends Banker Meetings: Not on file  . Marital Status: Not on file   Additional Social History:                         Sleep: Fair  Appetite:  Fair  Current Medications: Current Facility-Administered Medications  Medication Dose Route Frequency Provider Last Rate Last Admin  . acetaminophen (TYLENOL) tablet 650 mg  650 mg Oral Q6H PRN Nira Conn A, NP      . alum & mag hydroxide-simeth (MAALOX/MYLANTA) 200-200-20 MG/5ML suspension 30 mL  30 mL Oral Q4H PRN Nira Conn A, NP      . aspirin EC tablet 81 mg  81 mg Oral Daily Nira Conn A, NP   81 mg at 05/13/20 0815  . atorvastatin (LIPITOR) tablet 40 mg  40 mg Oral q1800 Nira Conn A, NP   40 mg at 05/13/20 1701  . docusate sodium (COLACE) capsule 100 mg  100 mg  Oral BID Karsen Fellows, Jackquline Denmark, MD   100 mg at 05/13/20 1702  . escitalopram (LEXAPRO) tablet 10 mg  10 mg Oral Daily Zanyiah Posten, Jackquline Denmark, MD   10 mg at 05/13/20 0816  . fluticasone (FLONASE) 50 MCG/ACT nasal spray 1 spray  1 spray Each Nare BID Thalia Party, MD   1 spray at 05/13/20 0816  . hydroxypropyl methylcellulose / hypromellose (ISOPTO TEARS / GONIOVISC) 2.5 % ophthalmic solution 2 drop  2 drop Both Eyes PRN Dacoda Spallone, Wilho T, MD      . lisinopril (ZESTRIL) tablet 20 mg  20 mg Oral Daily Nira Conn A, NP   20 mg at 05/13/20 0816  . loratadine (CLARITIN) tablet 10 mg  10 mg Oral Daily Nira Conn A, NP   10 mg at 05/13/20 0815  . magnesium hydroxide (MILK OF MAGNESIA) suspension 30 mL  30 mL Oral Daily PRN Nira Conn A, NP   30 mL at 05/10/20 0837  . psyllium (HYDROCIL/METAMUCIL) 1 packet  1 packet Oral Daily Arles Rumbold, Jackquline Denmark, MD   1 packet at 05/13/20 0815  . senna (SENOKOT) tablet 17.2 mg  2 tablet Oral Daily PRN Storey Stangeland, Jackquline Denmark, MD   17.2 mg at 05/11/20 0831  . traZODone (DESYREL) tablet 75 mg  75 mg Oral QHS PRN Thalia Party, MD   75 mg at 05/12/20 2110    Lab Results: No results found for this or any previous visit (from the past 48 hour(s)).  Blood Alcohol level:  Lab Results  Component Value Date   ETH <10 05/07/2020    Metabolic Disorder Labs: No results found for: HGBA1C, MPG No results found for: PROLACTIN No results found for: CHOL, TRIG, HDL, CHOLHDL, VLDL, LDLCALC  Physical Findings: AIMS:  , ,  ,  ,    CIWA:    COWS:     Musculoskeletal: Strength & Muscle Tone: within normal limits Gait & Station: normal Patient leans: N/A  Psychiatric Specialty Exam: Physical Exam Vitals and nursing note reviewed.  Constitutional:      Appearance: He is well-developed.  HENT:     Head: Normocephalic and atraumatic.  Eyes:     Conjunctiva/sclera: Conjunctivae normal.     Pupils: Pupils are equal, round, and reactive to light.  Cardiovascular:     Heart sounds: Normal heart  sounds.  Pulmonary:     Effort: Pulmonary effort is normal.  Abdominal:     Palpations: Abdomen is soft.  Musculoskeletal:  General: Normal range of motion.     Cervical back: Normal range of motion.  Skin:    General: Skin is warm and dry.  Neurological:     General: No focal deficit present.     Mental Status: He is alert.  Psychiatric:        Attention and Perception: Attention normal.        Mood and Affect: Mood is depressed. Affect is blunt.        Speech: Speech normal.        Behavior: Behavior is cooperative.        Thought Content: Thought content includes suicidal ideation. Thought content does not include suicidal plan.        Cognition and Memory: Cognition normal.        Judgment: Judgment normal.     Review of Systems  Constitutional: Negative.   HENT: Negative.   Eyes: Negative.   Respiratory: Negative.   Cardiovascular: Negative.   Gastrointestinal: Negative.   Musculoskeletal: Negative.   Skin: Negative.   Neurological: Negative.   Psychiatric/Behavioral: Positive for dysphoric mood and suicidal ideas.    Blood pressure 140/85, pulse 67, temperature 98.3 F (36.8 C), temperature source Oral, resp. rate 14, height 6' (1.829 m), weight 90.7 kg, SpO2 96 %.Body mass index is 27.12 kg/m.  General Appearance: Casual  Eye Contact:  Good  Speech:  Clear and Coherent  Volume:  Normal  Mood:  Depressed  Affect:  Depressed  Thought Process:  Coherent  Orientation:  Full (Time, Place, and Person)  Thought Content:  Illogical and Rumination  Suicidal Thoughts:  Yes.  without intent/plan  Homicidal Thoughts:  No  Memory:  Immediate;   Fair Recent;   Fair Remote;   Fair  Judgement:  Fair  Insight:  Shallow  Psychomotor Activity:  Decreased  Concentration:  Concentration: Fair  Recall:  [employer] of Knowledge:  Fair  Language:  Fair  Akathisia:  No  Handed:  Right  AIMS (if indicated):     Assets:  Desire for Improvement Housing  ADL's:  Intact   Cognition:  WNL  Sleep:  Number of Hours: 8     Treatment Plan Summary: Daily contact with patient to assess and evaluate symptoms and progress in treatment, Medication management and Plan No change to antidepressant medicine.  Still early in the course to know if it would be effective.  Spent time with him doing some cognitive restructuring and encouragement.  Tried to help to address some of his negativity with reframing things and orienting to reality.  Encourage group attendance.  Mordecai Rasmussen, MD 05/13/2020, 5:15 PM

## 2020-05-13 NOTE — Plan of Care (Signed)
D- Patient alert and oriented. Patient presents in a depressed, but pleasant mood on assessment reporting that he slept fair last night and had no major complaints to voice to this Clinical research associate. Patient reported a "3/10" depression and anxiety level, stating , "it depends on the what time of the day you ask me". Patient did also state "I feel like I'm crazier than when I came in". Patient continues to endorse passive SI, stating that these thoughts "come and go", but he did contract for safety with this Clinical research associate. Patient denies HI/AVH and pain at this time. Patient's goal for today is to "continue on positive path".   A- Scheduled medications administered to patient, per MD orders. Support and encouragement provided.  Routine safety checks conducted every 15 minutes.  Patient informed to notify staff with problems or concerns.  R- No adverse drug reactions noted. Patient contracts for safety at this time. Patient compliant with medications and treatment plan. Patient receptive, calm, and cooperative. Patient interacts well with others on the unit.  Patient remains safe at this time.  Problem: Education: Goal: Knowledge of  General Education information/materials will improve Outcome: Progressing Goal: Emotional status will improve Outcome: Progressing Goal: Mental status will improve Outcome: Progressing Goal: Verbalization of understanding the information provided will improve Outcome: Progressing   Problem: Activity: Goal: Interest or engagement in activities will improve Outcome: Progressing Goal: Sleeping patterns will improve Outcome: Progressing   Problem: Coping: Goal: Ability to verbalize frustrations and anger appropriately will improve Outcome: Progressing Goal: Ability to demonstrate self-control will improve Outcome: Progressing   Problem: Health Behavior/Discharge Planning: Goal: Identification of resources available to assist in meeting health care needs will  improve Outcome: Progressing Goal: Compliance with treatment plan for underlying cause of condition will improve Outcome: Progressing   Problem: Physical Regulation: Goal: Ability to maintain clinical measurements within normal limits will improve Outcome: Progressing   Problem: Safety: Goal: Periods of time without injury will increase Outcome: Progressing   Problem: Education: Goal: Knowledge of disease or condition will improve Outcome: Progressing Goal: Understanding of discharge needs will improve Outcome: Progressing   Problem: Health Behavior/Discharge Planning: Goal: Ability to identify changes in lifestyle to reduce recurrence of condition will improve Outcome: Progressing Goal: Identification of resources available to assist in meeting health care needs will improve Outcome: Progressing   Problem: Physical Regulation: Goal: Complications related to the disease process, condition or treatment will be avoided or minimized Outcome: Progressing   Problem: Safety: Goal: Ability to remain free from injury will improve Outcome: Progressing   Problem: Education: Goal: Will be free of psychotic symptoms Outcome: Progressing Goal: Knowledge of the prescribed therapeutic regimen will improve Outcome: Progressing   Problem: Role Relationship: Goal: Ability to communicate needs accurately will improve Outcome: Progressing

## 2020-05-14 NOTE — Plan of Care (Signed)
Patient states he is feeling better but still endorses passive SI. Pt verbally contracts for safety  Problem: Education: Goal: Emotional status will improve Outcome: Progressing Goal: Mental status will improve Outcome: Progressing

## 2020-05-14 NOTE — Progress Notes (Signed)
Patient calm and pleasant during assessment denying SI/HI/AVH, pain. Patient endorses anxiety and depression. Patient compliant with medication administration per MD orders. Patient isolative to his room and minimal with staff and peers. Patient being monitored Q 15 minutes for safety per unit protocol. Pt remains safe on the unit. Patient stated he continues to feel better and thinks it is helping him to be here.

## 2020-05-14 NOTE — Tx Team (Signed)
Interdisciplinary Treatment and Diagnostic Plan Update  05/14/2020 Time of Session: 9:00AM  Patrick Hardin MRN: [id number]  Principal Diagnosis: Severe recurrent major depression (HCC)  Secondary Diagnoses: Principal Problem:   Severe recurrent major depression (HCC) Active Problems:   Hypertension   Cannabis abuse   Current Medications:  Current Facility-Administered Medications  Medication Dose Route Frequency Provider Last Rate Last Admin  . acetaminophen (TYLENOL) tablet 650 mg  650 mg Oral Q6H PRN Nira Conn A, NP      . alum & mag hydroxide-simeth (MAALOX/MYLANTA) 200-200-20 MG/5ML suspension 30 mL  30 mL Oral Q4H PRN Nira Conn A, NP      . aspirin EC tablet 81 mg  81 mg Oral Daily Nira Conn A, NP   81 mg at 05/14/20 0811  . atorvastatin (LIPITOR) tablet 40 mg  40 mg Oral q1800 Nira Conn A, NP   40 mg at 05/13/20 1701  . docusate sodium (COLACE) capsule 100 mg  100 mg Oral BID Clapacs, Jackquline Denmark, MD   100 mg at 05/14/20 0811  . escitalopram (LEXAPRO) tablet 10 mg  10 mg Oral Daily Clapacs, Jackquline Denmark, MD   10 mg at 05/14/20 0811  . fluticasone (FLONASE) 50 MCG/ACT nasal spray 1 spray  1 spray Each Nare BID Thalia Party, MD   1 spray at 05/14/20 1255  . hydroxypropyl methylcellulose / hypromellose (ISOPTO TEARS / GONIOVISC) 2.5 % ophthalmic solution 2 drop  2 drop Both Eyes PRN Clapacs, Brodric T, MD      . lisinopril (ZESTRIL) tablet 20 mg  20 mg Oral Daily Nira Conn A, NP   20 mg at 05/14/20 0811  . loratadine (CLARITIN) tablet 10 mg  10 mg Oral Daily Nira Conn A, NP   10 mg at 05/14/20 0811  . magnesium hydroxide (MILK OF MAGNESIA) suspension 30 mL  30 mL Oral Daily PRN Nira Conn A, NP   30 mL at 05/10/20 0837  . psyllium (HYDROCIL/METAMUCIL) 1 packet  1 packet Oral Daily Clapacs, Jackquline Denmark, MD   1 packet at 05/14/20 [phone]  . senna (SENOKOT) tablet 17.2 mg  2 tablet Oral Daily PRN Clapacs, Jackquline Denmark, MD   17.2 mg at 05/11/20 0831  . traZODone (DESYREL) tablet 75 mg  75 mg Oral  QHS PRN Thalia Party, MD   75 mg at 05/13/20 2207   PTA Medications: Medications Prior to Admission  Medication Sig Dispense Refill Last Dose  . aspirin 81 MG tablet Take 1 tablet by mouth daily.     Marland Kitchen atorvastatin (LIPITOR) 40 MG tablet Take 1 tablet by mouth daily.     . fluticasone (FLONASE) 50 MCG/ACT nasal spray Place 1 spray into both nostrils daily.     Marland Kitchen lisinopril (PRINIVIL,ZESTRIL) 40 MG tablet Take 0.5 tablets by mouth daily.     Marland Kitchen loratadine (CLARITIN) 10 MG tablet Take by mouth.     . metoprolol succinate (TOPROL-XL) 50 MG 24 hr tablet Take by mouth.     . Multiple Vitamin (MULTI-VITAMINS) TABS Take by mouth.       Patient Stressors:    Patient Strengths:    Treatment Modalities: Medication Management, Group therapy, Case management,  1 to 1 session with clinician, Psychoeducation, Recreational therapy.   Physician Treatment Plan for Primary Diagnosis: Severe recurrent major depression (HCC) Long Term Goal(s): Improvement in symptoms so as ready for discharge Improvement in symptoms so as ready for discharge   Short Term Goals: Ability to verbalize feelings will improve Ability  to disclose and discuss suicidal ideas Ability to maintain clinical measurements within normal limits will improve Compliance with prescribed medications will improve  Medication Management: Evaluate patient's response, side effects, and tolerance of medication regimen.  Therapeutic Interventions: 1 to 1 sessions, Unit Group sessions and Medication administration.  Evaluation of Outcomes: Progressing  Physician Treatment Plan for Secondary Diagnosis: Principal Problem:   Severe recurrent major depression (HCC) Active Problems:   Hypertension   Cannabis abuse  Long Term Goal(s): Improvement in symptoms so as ready for discharge Improvement in symptoms so as ready for discharge   Short Term Goals: Ability to verbalize feelings will improve Ability to disclose and discuss suicidal  ideas Ability to maintain clinical measurements within normal limits will improve Compliance with prescribed medications will improve     Medication Management: Evaluate patient's response, side effects, and tolerance of medication regimen.  Therapeutic Interventions: 1 to 1 sessions, Unit Group sessions and Medication administration.  Evaluation of Outcomes: Progressing   RN Treatment Plan for Primary Diagnosis: Severe recurrent major depression (HCC) Long Term Goal(s): Knowledge of disease and therapeutic regimen to maintain health will improve  Short Term Goals: Ability to verbalize frustration and anger appropriately will improve, Ability to demonstrate self-control, Ability to participate in decision making will improve, Ability to verbalize feelings will improve, Ability to identify and develop effective coping behaviors will improve and Compliance with prescribed medications will improve  Medication Management: RN will administer medications as ordered by provider, will assess and evaluate patient's response and provide education to patient for prescribed medication. RN will report any adverse and/or side effects to prescribing provider.  Therapeutic Interventions: 1 on 1 counseling sessions, Psychoeducation, Medication administration, Evaluate responses to treatment, Monitor vital signs and CBGs as ordered, Perform/monitor CIWA, COWS, AIMS and Fall Risk screenings as ordered, Perform wound care treatments as ordered.  Evaluation of Outcomes: Progressing   LCSW Treatment Plan for Primary Diagnosis: Severe recurrent major depression (HCC) Long Term Goal(s): Safe transition to appropriate next level of care at discharge, Engage patient in therapeutic group addressing interpersonal concerns.  Short Term Goals: Engage patient in aftercare planning with referrals and resources, Increase social support, Increase ability to appropriately verbalize feelings, Increase emotional regulation,  Facilitate acceptance of mental health diagnosis and concerns, Facilitate patient progression through stages of change regarding substance use diagnoses and concerns, Identify triggers associated with mental health/substance abuse issues and Increase skills for wellness and recovery  Therapeutic Interventions: Assess for all discharge needs, 1 to 1 time with Social worker, Explore available resources and support systems, Assess for adequacy in community support network, Educate family and significant other(s) on suicide prevention, Complete Psychosocial Assessment, Interpersonal group therapy.  Evaluation of Outcomes: Progressing   Progress in Treatment: Attending groups: Yes. Participating in groups: Yes. Taking medication as prescribed: Yes. Toleration medication: Yes. Family/Significant other contact made: No, will contact:  pt declined collateral contact.  SPE completed with the patient.  Patient understands diagnosis: Yes. Discussing patient identified problems/goals with staff: Yes. Medical problems stabilized or resolved: Yes. Denies suicidal/homicidal ideation: Yes. Issues/concerns per patient self-inventory: No. Other: none  New problem(s) identified: No, Describe:  none  New Short Term/Long Term Goal(s): detox, elimination of symptoms of psychosis, medication management for mood stabilization; elimination of SI thoughts; development of comprehensive mental wellness/sobriety plan.  Update 05/14/2020: No changes at this time.  Patient Goals:  "I don't know"  Update 05/14/2020: No changes at this time.  Discharge Plan or Barriers: Patient reports that he plans on  returning to his home.  He reports that he will follow up with the [facility] in [place].  Update 05/14/2020: No changes at this time.  Reason for Continuation of Hospitalization: Anxiety Depression Medication stabilization Suicidal ideation  Estimated Length of Stay:  1-7 days  Recreational  Therapy: Patient: N/A Patient Goal: Patient will engage in groups without prompting or encouragement from LRT x3 group sessions within 5 recreation therapy group sessions  Attendees: Patient:  05/14/2020 1:31 PM  Physician: Dr. Toni Amend, MD 05/14/2020 1:31 PM  Nursing:  05/14/2020 1:31 PM  RN Care Manager: 05/14/2020 1:31 PM  Social Worker: Penni Homans, LCSW 05/14/2020 1:31 PM  Recreational Therapist:  05/14/2020 1:31 PM  Other:  05/14/2020 1:31 PM  Other:  05/14/2020 1:31 PM  Other: 05/14/2020 1:31 PM    Scribe for Treatment Team: [name], LCSW 05/14/2020 1:31 PM

## 2020-05-14 NOTE — [facility] Counselor (Signed)
CSW attempted to schedule the pt's aftercare appointment.  CSW left HIPAA compliant voicemail.  Penni Homans, MSW, LCSW 05/14/2020 3:59 PM

## 2020-05-14 NOTE — Progress Notes (Signed)
Pt is alert and oriented to person, place, time and situation. Pt is calm, cooperative, affect is flat, denies homicidal ideation, denies hallucinations, reports feelings of depression and anxiety, rates depression a 7/10 on a 0-10, 10 being worst, rates anxiety 6/10 on a 0-10, 6 being worst. Pt endorses passive SI, no plan and no intent. Pt contracts for safety. Pt is isolative to his room, minimal interaction with staff and peers, medication complaint. Will continue to monitor pt per Q15 minute face checks and monitor for safety and progress.

## 2020-05-14 NOTE — Progress Notes (Signed)
[facility] MD Progress Note  05/14/2020 3:36 PM Patrick Hardin  MRN:  [id number] Subjective: Follow-up for this patient with depression.  Saw him today after he had been up out of his room interacting with others.  He reports that his mood is improved.  Less sad.  Less hopeless.  No active suicidal intent or plan although still some occasional suicidal thoughts.  No new physical complaints. Principal Problem: Severe recurrent major depression (HCC) Diagnosis: Principal Problem:   Severe recurrent major depression (HCC) Active Problems:   Hypertension   Cannabis abuse  Total Time spent with patient: 30 minutes  Past Psychiatric History: Past history of alcohol abuse and more recently serious depression  Past Medical History:  Past Medical History:  Diagnosis Date  . Depression   . Diverticulosis   . Hypertension   . PUD (peptic ulcer disease)     Past Surgical History:  Procedure Laterality Date  . CARDIAC CATHETERIZATION  2006   Stenting at [facility]  . CATARACT EXTRACTION W/ INTRAOCULAR LENS IMPLANT    . CORONARY ANGIOPLASTY WITH STENT PLACEMENT  2006  . HYDROCELE EXCISION / REPAIR  2014   repair. [facility]  . TONSILLECTOMY AND ADENOIDECTOMY  1950   Family History:  Family History  Problem Relation Age of Onset  . Leukemia Father    Family Psychiatric  History: See previous Social History:  Social History   Substance and Sexual Activity  Alcohol Use No     Social History   Substance and Sexual Activity  Drug Use Yes  . Types: Marijuana   Comment: Acid    Social History   Socioeconomic History  . Marital status: Widowed    Spouse name: Not on file  . Number of children: 2  . Years of education: Not on file  . Highest education level: Not on file  Occupational History  . Not on file  Tobacco Use  . Smoking status: Former Smoker    Types: Cigarettes  . Smokeless tobacco: Never Used  . Tobacco comment: quit in 1997  Vaping Use  . Vaping Use: Never used  Substance and  Sexual Activity  . Alcohol use: No  . Drug use: Yes    Types: Marijuana    Comment: Acid  . Sexual activity: Not on file  Other Topics Concern  . Not on file  Social History Narrative  . Not on file   Social Determinants of Health   Financial Resource Strain:   . Difficulty of Paying Living Expenses: Not on file  Food Insecurity:   . Worried About Programme researcher, broadcasting/film/video in the Last Year: Not on file  . Ran Out of Food in the Last Year: Not on file  Transportation Needs:   . Lack of Transportation (Medical): Not on file  . Lack of Transportation (Non-Medical): Not on file  Physical Activity:   . Days of Exercise per Week: Not on file  . Minutes of Exercise per Session: Not on file  Stress:   . Feeling of Stress : Not on file  Social Connections:   . Frequency of Communication with Friends and Family: Not on file  . Frequency of Social Gatherings with Friends and Family: Not on file  . Attends Religious Services: Not on file  . Active Member of Clubs or Organizations: Not on file  . Attends Banker Meetings: Not on file  . Marital Status: Not on file   Additional Social History:  Sleep: Fair  Appetite:  Fair  Current Medications: Current Facility-Administered Medications  Medication Dose Route Frequency Provider Last Rate Last Admin  . acetaminophen (TYLENOL) tablet 650 mg  650 mg Oral Q6H PRN Nira Conn A, NP      . alum & mag hydroxide-simeth (MAALOX/MYLANTA) 200-200-20 MG/5ML suspension 30 mL  30 mL Oral Q4H PRN Nira Conn A, NP      . aspirin EC tablet 81 mg  81 mg Oral Daily Nira Conn A, NP   81 mg at 05/14/20 0811  . atorvastatin (LIPITOR) tablet 40 mg  40 mg Oral q1800 Nira Conn A, NP   40 mg at 05/13/20 1701  . docusate sodium (COLACE) capsule 100 mg  100 mg Oral BID Abe Schools, Jackquline Denmark, MD   100 mg at 05/14/20 0811  . escitalopram (LEXAPRO) tablet 10 mg  10 mg Oral Daily Kohl Polinsky, Jackquline Denmark, MD   10 mg at 05/14/20  0811  . fluticasone (FLONASE) 50 MCG/ACT nasal spray 1 spray  1 spray Each Nare BID Thalia Party, MD   1 spray at 05/14/20 1255  . hydroxypropyl methylcellulose / hypromellose (ISOPTO TEARS / GONIOVISC) 2.5 % ophthalmic solution 2 drop  2 drop Both Eyes PRN Farren Landa, Johannes T, MD      . lisinopril (ZESTRIL) tablet 20 mg  20 mg Oral Daily Nira Conn A, NP   20 mg at 05/14/20 0811  . loratadine (CLARITIN) tablet 10 mg  10 mg Oral Daily Nira Conn A, NP   10 mg at 05/14/20 0811  . magnesium hydroxide (MILK OF MAGNESIA) suspension 30 mL  30 mL Oral Daily PRN Nira Conn A, NP   30 mL at 05/10/20 0837  . psyllium (HYDROCIL/METAMUCIL) 1 packet  1 packet Oral Daily Kayliana Codd, Jackquline Denmark, MD   1 packet at 05/14/20 [phone]  . senna (SENOKOT) tablet 17.2 mg  2 tablet Oral Daily PRN Murtaza Shell, Jackquline Denmark, MD   17.2 mg at 05/11/20 0831  . traZODone (DESYREL) tablet 75 mg  75 mg Oral QHS PRN Thalia Party, MD   75 mg at 05/13/20 2207    Lab Results: No results found for this or any previous visit (from the past 48 hour(s)).  Blood Alcohol level:  Lab Results  Component Value Date   ETH <10 05/07/2020    Metabolic Disorder Labs: No results found for: HGBA1C, MPG No results found for: PROLACTIN No results found for: CHOL, TRIG, HDL, CHOLHDL, VLDL, LDLCALC  Physical Findings: AIMS:  , ,  ,  ,    CIWA:    COWS:     Musculoskeletal: Strength & Muscle Tone: within normal limits Gait & Station: normal Patient leans: N/A  Psychiatric Specialty Exam: Physical Exam Vitals and nursing note reviewed.  Constitutional:      Appearance: He is well-developed.  HENT:     Head: Normocephalic and atraumatic.  Eyes:     Conjunctiva/sclera: Conjunctivae normal.     Pupils: Pupils are equal, round, and reactive to light.  Cardiovascular:     Heart sounds: Normal heart sounds.  Pulmonary:     Effort: Pulmonary effort is normal.  Abdominal:     Palpations: Abdomen is soft.  Musculoskeletal:        General: Normal  range of motion.     Cervical back: Normal range of motion.  Skin:    General: Skin is warm and dry.  Neurological:     General: No focal deficit present.     Mental Status:  He is alert.  Psychiatric:        Attention and Perception: Attention normal.        Mood and Affect: Mood is depressed.        Speech: Speech is delayed.        Behavior: Behavior is slowed.        Thought Content: Thought content includes suicidal ideation. Thought content does not include suicidal plan.        Cognition and Memory: Cognition normal.        Judgment: Judgment normal.     Review of Systems  Constitutional: Negative.   HENT: Negative.   Eyes: Negative.   Respiratory: Negative.   Cardiovascular: Negative.   Gastrointestinal: Negative.   Musculoskeletal: Negative.   Skin: Negative.   Neurological: Negative.   Psychiatric/Behavioral: Positive for dysphoric mood.    Blood pressure 137/85, pulse 71, temperature 97.8 F (36.6 C), temperature source Oral, resp. rate 17, height 6' (1.829 m), weight 90.7 kg, SpO2 98 %.Body mass index is 27.12 kg/m.  General Appearance: Casual  Eye Contact:  Good  Speech:  Clear and Coherent  Volume:  Normal  Mood:  Euthymic  Affect:  Congruent  Thought Process:  Goal Directed  Orientation:  Full (Time, Place, and Person)  Thought Content:  Logical  Suicidal Thoughts:  No  Homicidal Thoughts:  No  Memory:  Immediate;   Fair Recent;   Fair Remote;   Fair  Judgement:  Fair  Insight:  Fair  Psychomotor Activity:  Normal  Concentration:  Concentration: Fair  Recall:  [employer] of Knowledge:  Fair  Language:  Fair  Akathisia:  No  Handed:  Right  AIMS (if indicated):     Assets:  Desire for Improvement  ADL's:  Intact  Cognition:  WNL  Sleep:  Number of Hours: 6.75     Treatment Plan Summary: Daily contact with patient to assess and evaluate symptoms and progress in treatment, Medication management and Plan Psychoeducation and supportive  counseling and therapy.  Encourage group attendance.  Review medication plan.  No change to medicine for today.  Patient is showing improvement and we can hope for discharge perhaps by the end of the week.  Mordecai Rasmussen, MD 05/14/2020, 3:36 PM

## 2020-05-14 NOTE — Progress Notes (Addendum)
Recreation Therapy Notes  Date: 05/14/2020  Time: 9:30 am   Location: Craft room  Behavioral response: Appropriate  Intervention Topic: Happiness    Discussion/Intervention:  Group content today was focused on Happiness. The group defined happiness and described where happiness comes from. Individuals identified what makes them happy and how they go about making others happy. Patients expressed things that stop them from being happy and ways they can improve their happiness. The group stated reasons why it is important to be happy. The group participated in the intervention "My Happiness", where they had a chance to identify and express things that make them happy.  Clinical Observations/Feedback: Patient came to group and was focused on what peers and staff had to say about happiness. He explained that he is unsure of what makes him happy. Individual was social with peers and staff while participating in the intervention.  Shai Mckenzie LRT/CTRS             Temara Lanum 05/14/2020 11:19 AM

## 2020-05-15 NOTE — Progress Notes (Signed)
Patient calm and pleasant during assessment denying SI/HI/AVH, pain. Patient endorses anxiety and depression. Patient compliant with medication administration per MD orders. Patient isolative to his room and minimal with staff and peers. Patient being monitored Q 15 minutes for safety per unit protocol. Pt remains safe on the unit. Patient stated he continues to feel better and thinks it is helping him to be here.  

## 2020-05-15 NOTE — Progress Notes (Signed)
[place] NOVEL CORONAVIRUS (COVID-19) DAILY CHECK-OFF SYMPTOMS - answer yes or no to each - every day NO YES  Have you had a fever in the past 24 hours?  . Fever (Temp > 37.80C / 100F) X   Have you had any of these symptoms in the past 24 hours? . New Cough .  Sore Throat  .  Shortness of Breath .  Difficulty Breathing .  Unexplained Body Aches   X   Have you had any one of these symptoms in the past 24 hours not related to allergies?   . Runny Nose .  Nasal Congestion .  Sneezing   X   If you have had runny nose, nasal congestion, sneezing in the past 24 hours, has it worsened?  X   EXPOSURES - check yes or no X   Have you traveled outside the state in the past 14 days?  X   Have you been in contact with someone with a confirmed diagnosis of COVID-19 or PUI in the past 14 days without wearing appropriate PPE?  X   Have you been living in the same home as a person with confirmed diagnosis of COVID-19 or a PUI (household contact)?    X   Have you been diagnosed with COVID-19?    X              What to do next: Answered NO to all: Answered YES to anything:   Proceed with unit schedule Follow the [facility] Inpatient Flowsheet.   Pt visible in dayroom for majority of this shift. Observed interacting well with peers and staff in dayroom, scheduled groups and in medication window. Presents with congruent affect, fair eye contact. Rates his depression and anxiety both 2/10. States he slept well last night with good appetite, low energy and good concentration level. A & O X3. Currently denies SI, HI, AVH and pain this shift. Remains medication compliant. Denies adverse drug reactions at this time. Attends and participate in scheduled unit groups.  Emotional support and encouragement provided to pt throughout this shift as needed. Medications given as ordered with verbal education and effect monitored. Q 15 minutes safety checks continues without self harm gestures or outburst to note thus far.   Pt receptive to care. Tolerates all PO intake well. Denies concerns at this time. Getting needs met safely on unit.

## 2020-05-15 NOTE — Plan of Care (Signed)
Patient states he continues to feel better and is hopeful he will D/C soon  Problem: Education: Goal: Emotional status will improve Outcome: Progressing Goal: Mental status will improve Outcome: Progressing

## 2020-05-15 NOTE — Plan of Care (Signed)
°  Problem: Group Participation °Goal: STG - Patient will engage in groups without prompting or encouragement from LRT x3 group sessions within 5 recreation therapy group sessions °Description: STG - Patient will engage in groups without prompting or encouragement from LRT x3 group sessions within 5 recreation therapy group sessions °Outcome: Progressing °  °

## 2020-05-15 NOTE — [facility] Counselor (Signed)
CSW again attempted to schedule the patient's aftercare appointment.    CSW spoke with several staff members at the [facility] [place].  There was confusion on their end on if the patient was eligible/registered or not.  Eventually CSW had to provide contact information as they figured it out.   CSW received a return call from [place] at [place].  She declined to schedule an appointment with this CSW, stating that patient must speak with them to get scheduled for the appointment.  CSW explained that CSW was in office, phone were not on on the unit and CSW was attempting so support patient in scheduling.  Monica continued to decline.  CSW took down her contact information for CSW to attempt to schedule the patient on 05/16/2020.  CSW did provide the information to [facility] nurse [place] as well.   Patient needs to call [phone] ext [phone] to get scheduled.  Penni Homans, MSW, LCSW 05/15/2020 3:43 PM

## 2020-05-15 NOTE — Progress Notes (Signed)
Recreation Therapy Notes  Date: 05/15/2020  Time: 9:30 am   Location: Craft room  Behavioral response: Appropriate  Intervention Topic: Coping Skills  Discussion/Intervention:  Group content on today was focused on coping skills. The group defined what coping skills are and when they normally use coping skills. Individuals described how they normally cope with thing and the coping skills they normally use. Patients expressed why it is important to cope with things and how not coping with things can affect you. The group participated in the intervention "My coping box" and made coping boxes while adding coping skills they could use in the future to the box.  Clinical Observations/Feedback: Patient came to group and expressed that he uses the serenity prayer as a coping skill. Participant  identified a gratitude list and attending a 12 step program as a way to improve his coping skills. Individual was social with peers and staff while participating in the intervention.  Paxon Propes LRT/CTRS         Denisa Enterline 05/15/2020 1:34 PM

## 2020-05-15 NOTE — Progress Notes (Signed)
[facility] MD Progress Note  05/15/2020 4:22 PM Patrick Hardin  MRN:  [id number] Subjective: Patient seen chart reviewed.  Patient says he is feeling better today.  Denies suicidal ideation.  Mood is improved.  Less depressed and more optimistic. Principal Problem: Severe recurrent major depression (HCC) Diagnosis: Principal Problem:   Severe recurrent major depression (HCC) Active Problems:   Hypertension   Cannabis abuse  Total Time spent with patient: 30 minutes  Past Psychiatric History: Past history substance abuse  Past Medical History:  Past Medical History:  Diagnosis Date  . Depression   . Diverticulosis   . Hypertension   . PUD (peptic ulcer disease)     Past Surgical History:  Procedure Laterality Date  . CARDIAC CATHETERIZATION  2006   Stenting at [facility]  . CATARACT EXTRACTION W/ INTRAOCULAR LENS IMPLANT    . CORONARY ANGIOPLASTY WITH STENT PLACEMENT  2006  . HYDROCELE EXCISION / REPAIR  2014   repair. [facility]  . TONSILLECTOMY AND ADENOIDECTOMY  1950   Family History:  Family History  Problem Relation Age of Onset  . Leukemia Father    Family Psychiatric  History: See previous Social History:  Social History   Substance and Sexual Activity  Alcohol Use No     Social History   Substance and Sexual Activity  Drug Use Yes  . Types: Marijuana   Comment: Acid    Social History   Socioeconomic History  . Marital status: Widowed    Spouse name: Not on file  . Number of children: 2  . Years of education: Not on file  . Highest education level: Not on file  Occupational History  . Not on file  Tobacco Use  . Smoking status: Former Smoker    Types: Cigarettes  . Smokeless tobacco: Never Used  . Tobacco comment: quit in 1997  Vaping Use  . Vaping Use: Never used  Substance and Sexual Activity  . Alcohol use: No  . Drug use: Yes    Types: Marijuana    Comment: Acid  . Sexual activity: Not on file  Other Topics Concern  . Not on file  Social History  Narrative  . Not on file   Social Determinants of Health   Financial Resource Strain:   . Difficulty of Paying Living Expenses: Not on file  Food Insecurity:   . Worried About Programme researcher, broadcasting/film/video in the Last Year: Not on file  . Ran Out of Food in the Last Year: Not on file  Transportation Needs:   . Lack of Transportation (Medical): Not on file  . Lack of Transportation (Non-Medical): Not on file  Physical Activity:   . Days of Exercise per Week: Not on file  . Minutes of Exercise per Session: Not on file  Stress:   . Feeling of Stress : Not on file  Social Connections:   . Frequency of Communication with Friends and Family: Not on file  . Frequency of Social Gatherings with Friends and Family: Not on file  . Attends Religious Services: Not on file  . Active Member of Clubs or Organizations: Not on file  . Attends Banker Meetings: Not on file  . Marital Status: Not on file   Additional Social History:                         Sleep: Fair  Appetite:  Fair  Current Medications: Current Facility-Administered Medications  Medication Dose Route Frequency  Provider Last Rate Last Admin  . acetaminophen (TYLENOL) tablet 650 mg  650 mg Oral Q6H PRN Nira Conn A, NP      . alum & mag hydroxide-simeth (MAALOX/MYLANTA) 200-200-20 MG/5ML suspension 30 mL  30 mL Oral Q4H PRN Nira Conn A, NP      . aspirin EC tablet 81 mg  81 mg Oral Daily Nira Conn A, NP   81 mg at 05/15/20 0823  . atorvastatin (LIPITOR) tablet 40 mg  40 mg Oral q1800 Nira Conn A, NP   40 mg at 05/14/20 1711  . docusate sodium (COLACE) capsule 100 mg  100 mg Oral BID Amen Dargis, Jackquline Denmark, MD   100 mg at 05/15/20 0823  . escitalopram (LEXAPRO) tablet 10 mg  10 mg Oral Daily Lenola Lockner, Jackquline Denmark, MD   10 mg at 05/15/20 0823  . fluticasone (FLONASE) 50 MCG/ACT nasal spray 1 spray  1 spray Each Nare BID Thalia Party, MD   1 spray at 05/15/20 0834  . hydroxypropyl methylcellulose / hypromellose  (ISOPTO TEARS / GONIOVISC) 2.5 % ophthalmic solution 2 drop  2 drop Both Eyes PRN Amanda Pote, Chais T, MD      . lisinopril (ZESTRIL) tablet 20 mg  20 mg Oral Daily Nira Conn A, NP   20 mg at 05/15/20 0823  . loratadine (CLARITIN) tablet 10 mg  10 mg Oral Daily Nira Conn A, NP   10 mg at 05/15/20 0823  . magnesium hydroxide (MILK OF MAGNESIA) suspension 30 mL  30 mL Oral Daily PRN Nira Conn A, NP   30 mL at 05/10/20 0837  . psyllium (HYDROCIL/METAMUCIL) 1 packet  1 packet Oral Daily Aarush Stukey, Jackquline Denmark, MD   1 packet at 05/15/20 [phone]  . senna (SENOKOT) tablet 17.2 mg  2 tablet Oral Daily PRN Ilario Dhaliwal, Jackquline Denmark, MD   17.2 mg at 05/11/20 0831  . traZODone (DESYREL) tablet 75 mg  75 mg Oral QHS PRN Thalia Party, MD   75 mg at 05/14/20 2123    Lab Results: No results found for this or any previous visit (from the past 48 hour(s)).  Blood Alcohol level:  Lab Results  Component Value Date   ETH <10 05/07/2020    Metabolic Disorder Labs: No results found for: HGBA1C, MPG No results found for: PROLACTIN No results found for: CHOL, TRIG, HDL, CHOLHDL, VLDL, LDLCALC  Physical Findings: AIMS:  , ,  ,  ,    CIWA:    COWS:     Musculoskeletal: Strength & Muscle Tone: within normal limits Gait & Station: normal Patient leans: N/A  Psychiatric Specialty Exam: Physical Exam Vitals and nursing note reviewed.  Constitutional:      Appearance: He is well-developed.  HENT:     Head: Normocephalic and atraumatic.  Eyes:     Conjunctiva/sclera: Conjunctivae normal.     Pupils: Pupils are equal, round, and reactive to light.  Cardiovascular:     Heart sounds: Normal heart sounds.  Pulmonary:     Effort: Pulmonary effort is normal.  Abdominal:     Palpations: Abdomen is soft.  Musculoskeletal:        General: Normal range of motion.     Cervical back: Normal range of motion.  Skin:    General: Skin is warm and dry.  Neurological:     General: No focal deficit present.     Mental Status:  He is alert.  Psychiatric:        Mood and Affect: Mood normal.  Review of Systems  Constitutional: Negative.   HENT: Negative.   Eyes: Negative.   Respiratory: Negative.   Cardiovascular: Negative.   Gastrointestinal: Negative.   Musculoskeletal: Negative.   Skin: Negative.   Neurological: Negative.   Psychiatric/Behavioral: Negative.     Blood pressure 116/69, pulse 60, temperature 98.2 F (36.8 C), temperature source Oral, resp. rate 17, height 6' (1.829 m), weight 90.7 kg, SpO2 95 %.Body mass index is 27.12 kg/m.  General Appearance: Casual  Eye Contact:  Fair  Speech:  Clear and Coherent  Volume:  Normal  Mood:  Euthymic  Affect:  Congruent  Thought Process:  Goal Directed  Orientation:  Full (Time, Place, and Person)  Thought Content:  Logical  Suicidal Thoughts:  No  Homicidal Thoughts:  No  Memory:  Immediate;   Fair Recent;   Fair Remote;   Fair  Judgement:  Fair  Insight:  Fair  Psychomotor Activity:  Normal  Concentration:  Concentration: Fair  Recall:  [employer] of Knowledge:  Fair  Language:  Fair  Akathisia:  No  Handed:  Right  AIMS (if indicated):     Assets:  Desire for Improvement Housing Physical Health Resilience Social Support  ADL's:  Intact  Cognition:  WNL  Sleep:  Number of Hours: 7.5     Treatment Plan Summary: Daily contact with patient to assess and evaluate symptoms and progress in treatment, Medication management and Plan Patient seems to be doing much better.  Goes to groups.  Benefits.  Has good insight.  Denies any suicidal thoughts now.  Reviewed medication and overall treatment plan likely discharge 1 to 2 days  Mordecai Rasmussen, MD 05/15/2020, 4:22 PM

## 2020-05-16 NOTE — Plan of Care (Signed)
Patient is out of bed and active in the milieu. Pleasant on approach and denying thoughts of self harm. Denies hallucinations. Had breakfast and medications and currently eating lunch. Attending groups as scheduled. Has no concerns so far. Encouragements provided and safety maintained.

## 2020-05-16 NOTE — Plan of Care (Signed)
Patient states he is feeling better and is hopeful he will be leaving soon. Denies SI/HI/AVH  Problem: Education: Goal: Emotional status will improve Outcome: Progressing Goal: Mental status will improve Outcome: Progressing

## 2020-05-16 NOTE — Progress Notes (Signed)
Patient calm and pleasant during assessment denying SI/HI/AVH, pain. Patient endorses anxiety and depression. Patientcompliant with medication administration per MD orders.Patient isolative to his room and minimal with staff and peers. Patient being monitored Q 15 minutes for safety per unit protocol. Pt remains safe on the unit.Patient stated he continues to feel better and thinks it is helping him to be here.Patient stated to this writer that he is ready to leave tomorrow and is thankful for coming in and getting the helped he needed.

## 2020-05-16 NOTE — [facility] Group Notes (Signed)
[facility] Group Notes:  (Nursing/MHT/Case Management/Adjunct)  Date:  05/16/2020  Time:  10:27 PM  Type of Therapy:  Group Therapy  Participation Level:  Active  Participation Quality:  Appropriate  Affect:  Appropriate  Cognitive:  Alert  Insight:  Good  Engagement in Group:  Engaged and he like going outside to play basketball.  Modes of Intervention:  Support  Summary of Progress/Problems:  Patrick Hardin 05/16/2020, 10:27 PM

## 2020-05-16 NOTE — BHH Counselor (Signed)
[place] [facility] LCSW Note  05/16/2020   3:00PM  Type of Contact and Topic:  Discharge Disposition  CSW met with pt in order to obtain update of pt's efforts to [facility]. Pt confirmed he had yet to contact [place] due to being under the impression he was to do so post discharge. Pt confirmed intent to contact [facility] in [place] to determine wait period for follow up as well as to [facility] [place] to determine earliest available. Pt confirmed understanding of the need for himself to schedule appointments as relayed by [facility] upon CSW teams attempts to secure follow up.    Blane Ohara, LCSW 05/16/2020  3:15 PM

## 2020-05-16 NOTE — Progress Notes (Signed)
BRIEF PHARMACY NOTE   This patient attended and participated in Medication Management Group counseling led by [facility] staff pharmacist.  This interactive class reviews basic information about prescription medications and education on personal responsibility in medication management.  The class also includes general knowledge of 3 main classes of behavioral medications, including antipsychotics, antidepressants, and mood stabilizers.     Patient behavior was appropriate for group setting.   Educational materials sourced from:  "Medication Do's and Don'ts" from [email or web address]   "Mental Health Medications" from [facility] [email or web address]#[email or web address]    Albina Billet, PharmD, BCPS Clinical Pharmacist 05/16/2020 3:17 PM

## 2020-05-16 NOTE — Progress Notes (Signed)
[facility] MD Progress Note  05/16/2020 4:53 PM Patrick Hardin  MRN:  [id number] Subjective: Follow-up for this 79 year old man with depression.  Patient once again says he is feeling significantly better.  Denies suicidal thought.  Feeling more optimistic and hopeful. 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  Recent cannabis abuse.  Recent depression  Past Medical History:  Past Medical History:  Diagnosis Date  . Depression   . Diverticulosis   . Hypertension   . PUD (peptic ulcer disease)     Past Surgical History:  Procedure Laterality Date  . CARDIAC CATHETERIZATION  2006   [facility] at [facility]  . CATARACT EXTRACTION W/ INTRAOCULAR LENS IMPLANT    . CORONARY ANGIOPLASTY WITH STENT PLACEMENT  2006  . HYDROCELE EXCISION / REPAIR  2014   repair. [facility]  . TONSILLECTOMY AND ADENOIDECTOMY  1950   Family History:  Family History  Problem Relation Age of Onset  . Leukemia Father    Family Psychiatric  History: See previous Social History:  Social History   Substance and Sexual Activity  Alcohol Use No     Social History   Substance and Sexual Activity  Drug Use Yes  . Types: Marijuana   Comment: Acid    Social History   Socioeconomic History  . Marital status: Widowed    Spouse name: Not on file  . Number of children: 2  . Years of education: Not on file  . Highest education level: Not on file  Occupational History  . Not on file  Tobacco Use  . Smoking status: Former Smoker    Types: Cigarettes  . Smokeless tobacco: Never Used  . Tobacco comment: quit in 1997  Vaping Use  . Vaping Use: Never used  Substance and Sexual Activity  . Alcohol use: No  . Drug use: Yes    Types: Marijuana    Comment:  Acid  . Sexual activity: Not on file  Other Topics Concern  . Not on file  Social History Narrative  . Not on file   Social Determinants of Health   Financial Resource Strain:   . Difficulty of Paying Living Expenses: Not on file  Food Insecurity:   . Worried About Programme researcher, broadcasting/film/video in the Last Year: Not on file  . Ran Out of Food in the Last Year: Not on file  Transportation Needs:   . Lack of Transportation (Medical): Not on file  . Lack of Transportation (Non-Medical): Not on file  Physical Activity:   . Days of Exercise per Week: Not on file  . Minutes of Exercise per Session: Not on file  Stress:   . Feeling of Stress : Not on file  Social Connections:   . Frequency of Communication with Friends and Family: Not on file  . Frequency of Social Gatherings with Friends and Family: Not on file  . Attends Religious Services: Not on file  . Active Member of Clubs or Organizations: Not on file  . Attends Banker Meetings: Not on file  . Marital Status: Not on file   Additional Social History:                         Sleep: Fair  Appetite:  Fair  Current Medications: Current Facility-Administered Medications  Medication Dose Route Frequency Provider Last Rate Last Admin  . acetaminophen (TYLENOL) tablet 650 mg  650 mg Oral Q6H PRN Nira Conn A, NP      . alum & mag hydroxide-simeth (MAALOX/MYLANTA) 200-200-20 MG/5ML suspension 30 mL  30 mL Oral Q4H PRN Nira Conn A, NP      . aspirin EC tablet 81 mg  81 mg Oral Daily Nira Conn A, NP   81 mg at 05/16/20 6962  . atorvastatin (LIPITOR) tablet 40 mg  40 mg Oral q1800 Nira Conn A, NP   40 mg at 05/16/20 1651  . docusate sodium (COLACE) capsule 100 mg  100 mg Oral BID Cristian Grieves, Jackquline Denmark, MD   100 mg at 05/16/20 1651  . escitalopram (LEXAPRO) tablet 10 mg  10 mg Oral Daily Glenice Ciccone, Jackquline Denmark, MD   10 mg at 05/16/20 9528  . fluticasone (FLONASE) 50 MCG/ACT nasal spray 1 spray  1 spray Each Nare BID  Thalia Party, MD   1 spray at 05/16/20 4132  . hydroxypropyl methylcellulose / hypromellose (ISOPTO TEARS / GONIOVISC) 2.5 % ophthalmic solution 2 drop  2 drop Both Eyes PRN Sejla Marzano, Cordarro T, MD      . lisinopril (ZESTRIL) tablet 20 mg  20 mg Oral Daily Nira Conn A, NP   20 mg at 05/16/20 4401  . loratadine (CLARITIN) tablet 10 mg  10 mg Oral Daily Nira Conn A, NP   10 mg at 05/16/20 0272  . magnesium hydroxide (MILK OF MAGNESIA) suspension 30 mL  30 mL Oral Daily PRN Nira Conn A, NP   30 mL at 05/10/20 0837  . psyllium (HYDROCIL/METAMUCIL) 1 packet  1 packet Oral Daily Theia Dezeeuw, Jackquline Denmark, MD   1 packet at 05/16/20 0825  . senna (SENOKOT) tablet 17.2 mg  2 tablet Oral Daily PRN Jerren Flinchbaugh, Jackquline Denmark, MD   17.2 mg at 05/11/20 0831  . traZODone (DESYREL) tablet 75 mg  75 mg Oral QHS PRN Thalia Party, MD   75 mg at 05/15/20 2106    Lab Results: No results found for this or any previous visit (from the past 48 hour(s)).  Blood Alcohol level:  Lab Results  Component Value Date   ETH <10 05/07/2020    Metabolic Disorder Labs: No results found for: HGBA1C, MPG No results found for: PROLACTIN No results found for: CHOL, TRIG, HDL, CHOLHDL, VLDL, LDLCALC  Physical Findings: AIMS:  , ,  ,  ,    CIWA:    COWS:     Musculoskeletal: Strength & Muscle Tone: within normal limits Gait & Station: normal Patient leans: N/A  Psychiatric Specialty Exam: Physical Exam Vitals and nursing note reviewed.  Constitutional:      Appearance: He is well-developed.  HENT:     Head: Normocephalic and atraumatic.  Eyes:     Conjunctiva/sclera: Conjunctivae normal.     Pupils: Pupils are equal, round, and reactive to light.  Cardiovascular:     Heart sounds: Normal heart sounds.  Pulmonary:     Effort: Pulmonary effort is normal.  Abdominal:     Palpations: Abdomen is soft.  Musculoskeletal:        General: Normal range of motion.     Cervical back: Normal range of motion.  Skin:    General: Skin  is warm and dry.  Neurological:     General: No focal deficit present.     Mental Status: He is alert.  Psychiatric:        Mood and Affect: Mood normal.        Thought Content: Thought content normal.        Judgment: Judgment normal.     Review of Systems  Constitutional: Negative.   HENT: Negative.   Eyes: Negative.   Respiratory: Negative.   Cardiovascular: Negative.   Gastrointestinal: Negative.   Musculoskeletal: Negative.   Skin: Negative.   Neurological: Negative.   Psychiatric/Behavioral: Negative.     Blood pressure (!) 143/77, pulse 60, temperature 98.6 F (37 C), temperature source Oral, resp. rate 17, height 6' (1.829 m), weight 90.7 kg, SpO2 99 %.Body mass index is 27.12 kg/m.  General Appearance: Casual  Eye Contact:  Good  Speech:  Clear and Coherent  Volume:  Normal  Mood:  Euthymic  Affect:  Constricted  Thought Process:  Goal Directed  Orientation:  Full (Time, Place, and Person)  Thought Content:  Logical  Suicidal Thoughts:  No  Homicidal Thoughts:  No  Memory:  Immediate;   Fair Recent;   Fair Remote;   Fair  Judgement:  Fair  Insight:  Fair  Psychomotor Activity:  Normal  Concentration:  Concentration: Fair  Recall:  [employer] of Knowledge:  Fair  Language:  Fair  Akathisia:  No  Handed:  Right  AIMS (if indicated):     Assets:  Desire for Improvement Housing Physical Health Resilience  ADL's:  Intact  Cognition:  WNL  Sleep:  Number of Hours: 7.5     Treatment Plan Summary: Daily contact with patient to assess and evaluate symptoms and progress in treatment, Medication management and Plan Continue with current medicine.  Supportive counseling and therapy.  Review of recommendations for outpatient treatment centered on the [place].  Planning on discharge tomorrow.  Mordecai Rasmussen, MD 05/16/2020, 4:53 PM

## 2020-05-16 NOTE — Progress Notes (Signed)
Recreation Therapy Notes  Date: 05/16/2020  Time: 9:30 am  Location: Room 21    Behavioral response: Appropriate   Intervention Topic: Animal Assisted Therapy   Discussion/Intervention:  Animal Assisted Therapy took place today during group.  Animal Assisted Therapy is the planned inclusion of an animal in a patient's treatment plan. The patients were able to engage in therapy with an animal during group. Participants were educated on what a service dog is and the different between a support dog and a service dog. Patient were informed on the many animal needs there are and how their needs are similar. Individuals were enlightened on the process to get a service animal or support animal. Patients got the opportunity to pet the animal and were offered emotional support from the animal and staff.  Clinical Observations/Feedback:  Patient came to group and was on topic and was focused on what peers and staff had to say. Participant shared their experiences and history with animals. Individual was social with peers, staff and animal while participating in group.  Ruberta Holck LRT/CTRS         Dewain Platz 05/16/2020 12:34 PM 

## 2020-05-17 MED ORDER — HYPROMELLOSE (GONIOSCOPIC) 2.5 % OP SOLN: 2.0000 [drp] | OPHTHALMIC | 1 refills | Status: DC | PRN

## 2020-05-17 MED ORDER — LORATADINE 10 MG PO TABS
10.0000 mg | ORAL_TABLET | Freq: Every day | ORAL | 1 refills | Status: DC
Start: 2020-05-18 — End: 2023-11-03

## 2020-05-17 MED ORDER — LISINOPRIL 40 MG PO TABS
20.0000 mg | ORAL_TABLET | Freq: Every day | ORAL | 1 refills | Status: AC
Start: 2020-05-17 — End: ?

## 2020-05-17 MED ORDER — DOCUSATE SODIUM 100 MG PO CAPS
100.0000 mg | ORAL_CAPSULE | Freq: Two times a day (BID) | ORAL | 1 refills | Status: DC
Start: 2020-05-17 — End: 2023-11-03

## 2020-05-17 MED ORDER — ASPIRIN 81 MG PO TBEC
81.0000 mg | DELAYED_RELEASE_TABLET | Freq: Every day | ORAL | 1 refills | Status: AC
Start: 2020-05-18 — End: ?

## 2020-05-17 MED ORDER — ESCITALOPRAM OXALATE 10 MG PO TABS
10.0000 mg | ORAL_TABLET | Freq: Every day | ORAL | 1 refills | Status: AC
Start: 2020-05-18 — End: ?

## 2020-05-17 MED ORDER — ATORVASTATIN CALCIUM 40 MG PO TABS
40.0000 mg | ORAL_TABLET | Freq: Every day | ORAL | 1 refills | Status: DC
Start: 2020-05-17 — End: 2023-11-03

## 2020-05-17 MED ORDER — FLUTICASONE PROPIONATE 50 MCG/ACT NA SUSP
1.0000 | Freq: Two times a day (BID) | NASAL | 1 refills | Status: DC
Start: 2020-05-17 — End: 2023-11-03

## 2020-05-17 MED ORDER — TRAZODONE HCL 150 MG PO TABS
75.0000 mg | ORAL_TABLET | Freq: Every evening | ORAL | 1 refills | Status: DC | PRN
Start: 2020-05-17 — End: 2023-11-03

## 2020-05-17 MED ORDER — PSYLLIUM 95 % PO PACK: 1.0000 | PACK | Freq: Every day | ORAL | 1 refills | Status: DC

## 2020-05-17 NOTE — Progress Notes (Signed)
  [facility] Adult Case Management Discharge Plan :  Will you be returning to the same living situation after discharge:  Yes,  apartment her in [place] At discharge, do you have transportation home?: Yes,  SAFE transport Do you have the ability to pay for your medications: Yes,  insurance/MCR  Release of information consent forms completed and in the chart;  Patient's signature needed at discharge.  Patient to Follow up at:  Follow-up Information    [facility]   Contact information: [address] [facility] [place] [address] [phone]        [facility].   Specialty: General Practice Why: When I called to get you a hospital follow up appointment, they told me you do not currently have a primary care provider.  That is your portal of entry into the system.  Call eligibility at the above number to get an appointment. Contact information: [address] [place] [address] [phone]               Next level of care provider has access to [facility] Link:no  Safety Planning and Suicide Prevention discussed: Yes,  yes  Have you used any form of tobacco in the last 30 days? (Cigarettes, Smokeless Tobacco, Cigars, and/or Pipes): Yes  Has patient been referred to the Quitline?: Patient refused referral  Patient has been referred for addiction treatment: Pt. refused referral  Ida Rogue, LCSW 05/17/2020, 10:10 AM

## 2020-05-17 NOTE — BHH Suicide Risk Assessment (Signed)
[facility] Discharge Suicide Risk Assessment   Principal Problem: Severe recurrent major depression (HCC) Discharge Diagnoses: Principal Problem:   Severe recurrent major depression (HCC) Active Problems:   Hypertension   Cannabis abuse   Total Time spent with patient: 30 minutes  Musculoskeletal: Strength & Muscle Tone: within normal limits Gait & Station: normal Patient leans: N/A  Psychiatric Specialty Exam: Review of Systems  Constitutional: Negative.   HENT: Negative.   Eyes: Negative.   Respiratory: Negative.   Cardiovascular: Negative.   Gastrointestinal: Negative.   Musculoskeletal: Negative.   Skin: Negative.   Neurological: Negative.   Psychiatric/Behavioral: Negative.     Blood pressure 140/74, pulse 68, temperature 98.1 F (36.7 C), temperature source Oral, resp. rate 17, height 6' (1.829 m), weight 90.7 kg, SpO2 98 %.Body mass index is 27.12 kg/m.  General Appearance: Casual  Eye Contact::  Good  Speech:  Clear and Coherent409  Volume:  Normal  Mood:  Euthymic  Affect:  Congruent  Thought Process:  Goal Directed  Orientation:  Full (Time, Place, and Person)  Thought Content:  Logical  Suicidal Thoughts:  No  Homicidal Thoughts:  No  Memory:  Immediate;   Fair Recent;   Fair Remote;   Fair  Judgement:  Fair  Insight:  Fair  Psychomotor Activity:  Normal  Concentration:  Fair  Recall:  [employer] of Knowledge:Fair  Language: Fair  Akathisia:  No  Handed:  Right  AIMS (if indicated):     Assets:  Desire for Improvement Housing Physical Health Resilience  Sleep:  Number of Hours: 7.5  Cognition: WNL  ADL's:  Intact   Mental Status Per Nursing Assessment::   On Admission:  NA  Demographic Factors:  Male, Age 79 or older, Divorced or widowed, Caucasian and Living alone  Loss Factors: Decline in physical health  Historical Factors: Impulsivity  Risk Reduction Factors:   Sense of responsibility to family, Positive therapeutic relationship  and Positive coping skills or problem solving skills  Continued Clinical Symptoms:  Depression:   Comorbid alcohol abuse/dependence Alcohol/Substance Abuse/Dependencies  Cognitive Features That Contribute To Risk:  None    Suicide Risk:  Minimal: No identifiable suicidal ideation.  Patients presenting with no risk factors but with morbid ruminations; may be classified as minimal risk based on the severity of the depressive symptoms   Follow-up Information    [facility].   Contact information: [address] [facility] [place] [address] [phone]               Plan Of Care/Follow-up recommendations:  Activity:  Activity as tolerated Diet:  Regular diet Other:  [facility]  Mordecai Rasmussen, MD 05/17/2020, 9:31 AM

## 2020-05-17 NOTE — BHH Group Notes (Signed)
BHH Group Notes:  (Nursing/MHT/Case Management/Adjunct)  Date:  05/17/2020  Time:  10:11 AM  Type of Therapy:  COMMUNITY MEETING  Participation Level:  Active  Participation Quality:  Appropriate  Affect:  Appropriate  Cognitive:  Alert and Appropriate  Insight:  Appropriate  Engagement in Group:  Engaged  Modes of Intervention:  Discussion and Education  Summary of Progress/Problems:  Patrick Hardin 05/17/2020, 10:11 AM

## 2020-05-17 NOTE — Discharge Summary (Signed)
Physician Discharge Summary Note  Patient:  Patrick Hardin is an 79 y.o., male MRN:  [id number] DOB:  [birth year] Patient phone:  [phone] (home)  Patient address:   [address] [place] [address],  Total Time spent with patient: 30 minutes  Date of Admission:  05/09/2020 Date of Discharge: 05/17/2020  Reason for Admission: Admitted to the hospital because of severe depression with statements of suicidal ideation  Principal Problem: Severe recurrent major depression (HCC) Discharge Diagnoses: Principal Problem:   Severe recurrent major depression (HCC) Active Problems:   Hypertension   Cannabis abuse   Past Psychiatric History: Past history of substance abuse with sustained sobriety but recently relapsed into marijuana use during a period of severe depression  Past Medical History:  Past Medical History:  Diagnosis Date  . Depression   . Diverticulosis   . Hypertension   . PUD (peptic ulcer disease)     Past Surgical History:  Procedure Laterality Date  . CARDIAC CATHETERIZATION  2006   Stenting at [facility]  . CATARACT EXTRACTION W/ INTRAOCULAR LENS IMPLANT    . CORONARY ANGIOPLASTY WITH STENT PLACEMENT  2006  . HYDROCELE EXCISION / REPAIR  2014   repair. [facility]  . TONSILLECTOMY AND ADENOIDECTOMY  1950   Family History:  Family History  Problem Relation Age of Onset  . Leukemia Father    Family Psychiatric  History: See previous.  Positive for substance abuse Social History:  Social History   Substance and Sexual Activity  Alcohol Use No     Social History   Substance and Sexual Activity  Drug Use Yes  . Types: Marijuana   Comment: Acid    Social History   Socioeconomic History  . Marital status: Widowed    Spouse name: Not on file  . Number of children: 2  . Years of education: Not on file  . Highest education level: Not on file  Occupational History  . Not on file  Tobacco Use  . Smoking status: Former Smoker    Types: Cigarettes   . Smokeless tobacco: Never Used  . Tobacco comment: quit in 1997  Vaping Use  . Vaping Use: Never used  Substance and Sexual Activity  . Alcohol use: No  . Drug use: Yes    Types: Marijuana    Comment: Acid  . Sexual activity: Not on file  Other Topics Concern  . Not on file  Social History Narrative  . Not on file   Social Determinants of Health   Financial Resource Strain:   . Difficulty of Paying Living Expenses: Not on file  Food Insecurity:   . Worried About Programme researcher, broadcasting/film/video in the Last Year: Not on file  . Ran Out of Food in the Last Year: Not on file  Transportation Needs:   . Lack of Transportation (Medical): Not on file  . Lack of Transportation (Non-Medical): Not on file  Physical Activity:   . Days of Exercise per Week: Not on file  . Minutes of Exercise per Session: Not on file  Stress:   . Feeling of Stress : Not on file  Social Connections:   . Frequency of Communication with Friends and Family: Not on file  . Frequency of Social Gatherings with Friends and Family: Not on file  . Attends Religious Services: Not on file  . Active Member of Clubs or Organizations: Not on file  . Attends Banker Meetings: Not on file  . Marital Status: Not  on file    Hospital Course: Maintained on 15-minute checks.  Did not display any dangerous behavior in the hospital.  Patient was cooperative and appropriate with treatment.  Patient was started on antidepressant medicine which she tolerated well.  He was gradually encouraged to become more interactive with groups and interacted with other patients which he made every effort to do.  By the time of discharge he was consistently stating improved mood with no suicidal ideation.  [facility]  Physical Findings: AIMS:  , ,  ,  ,    CIWA:    COWS:     Musculoskeletal: Strength & Muscle Tone: within normal limits Gait & Station: normal Patient leans: N/A  Psychiatric Specialty  Exam: Physical Exam Vitals and nursing note reviewed.  Constitutional:      Appearance: He is well-developed.  HENT:     Head: Normocephalic and atraumatic.  Eyes:     Conjunctiva/sclera: Conjunctivae normal.     Pupils: Pupils are equal, round, and reactive to light.  Cardiovascular:     Heart sounds: Normal heart sounds.  Pulmonary:     Effort: Pulmonary effort is normal.  Abdominal:     Palpations: Abdomen is soft.  Musculoskeletal:        General: Normal range of motion.     Cervical back: Normal range of motion.  Skin:    General: Skin is warm and dry.  Neurological:     General: No focal deficit present.     Mental Status: He is alert.  Psychiatric:        Mood and Affect: Mood normal.        Thought Content: Thought content normal.     Review of Systems  Constitutional: Negative.   HENT: Negative.   Eyes: Negative.   Respiratory: Negative.   Cardiovascular: Negative.   Gastrointestinal: Negative.   Musculoskeletal: Negative.   Skin: Negative.   Neurological: Negative.   Psychiatric/Behavioral: Negative.     Blood pressure 140/74, pulse 68, temperature 98.1 F (36.7 C), temperature source Oral, resp. rate 17, height 6' (1.829 m), weight 90.7 kg, SpO2 98 %.Body mass index is 27.12 kg/m.  General Appearance: Casual  Eye Contact:  Good  Speech:  Clear and Coherent  Volume:  Normal  Mood:  Euthymic  Affect:  Congruent  Thought Process:  Coherent  Orientation:  Full (Time, Place, and Person)  Thought Content:  Logical  Suicidal Thoughts:  No  Homicidal Thoughts:  No  Memory:  Immediate;   Fair Recent;   Fair Remote;   Fair  Judgement:  Fair  Insight:  Fair  Psychomotor Activity:  Normal  Concentration:  Concentration: Fair  Recall:  [employer] of Knowledge:  Fair  Language:  Fair  Akathisia:  No  Handed:  Right  AIMS (if indicated):     Assets:  Desire for Improvement  ADL's:  Intact  Cognition:  WNL  Sleep:  Number of Hours: 7.5     Have  you used any form of tobacco in the last 30 days? (Cigarettes, Smokeless Tobacco, Cigars, and/or Pipes): Yes  Has this patient used any form of tobacco in the last 30 days? (Cigarettes, Smokeless Tobacco, Cigars, and/or Pipes) Yes, No  Blood Alcohol level:  Lab Results  Component Value Date   ETH <10 05/07/2020    Metabolic Disorder Labs:  No results found for: HGBA1C, MPG No results found for: PROLACTIN No results found for: CHOL, TRIG, HDL,  CHOLHDL, VLDL, [facility]  See Psychiatric Specialty Exam and Suicide Risk Assessment completed by Attending Physician prior to discharge.  Discharge destination:  Home  Is patient on multiple antipsychotic therapies at discharge:  No   Has Patient had three or more failed trials of antipsychotic monotherapy by history:  No  Recommended Plan for Multiple Antipsychotic Therapies: NA  Discharge Instructions    Diet - low sodium heart healthy   Complete by: As directed    Increase activity slowly   Complete by: As directed      Allergies as of 05/17/2020      Reactions   Advil [ibuprofen] Other (See Comments)   Gel Caps; Seizure   Viagra [sildenafil Citrate] Other (See Comments)   Headaches      Medication List    STOP taking these medications   aspirin 81 MG tablet Replaced by: aspirin 81 MG EC tablet   metoprolol succinate 50 MG 24 hr tablet Commonly known as: TOPROL-XL   Multi-Vitamins Tabs     TAKE these medications     Indication  aspirin 81 MG EC tablet Take 1 tablet (81 mg total) by mouth daily. Swallow whole. Start taking on: May 18, 2020 Replaces: aspirin 81 MG tablet  Indication: Stable Angina Pectoris   atorvastatin 40 MG tablet Commonly known as: LIPITOR Take 1 tablet (40 mg total) by mouth daily at 6 PM. What changed: when to take this  Indication: High Amount of Fats in the Blood   docusate sodium 100 MG capsule Commonly known as: COLACE Take 1 capsule (100 mg total) by mouth 2 (two) times daily.   Indication: Constipation   escitalopram 10 MG tablet Commonly known as: LEXAPRO Take 1 tablet (10 mg total) by mouth daily. Start taking on: May 18, 2020  Indication: Major Depressive Disorder   fluticasone 50 MCG/ACT nasal spray Commonly known as: FLONASE Place 1 spray into both nostrils 2 (two) times daily. What changed: when to take this  Indication: Allergic Rhinitis   hydroxypropyl methylcellulose / hypromellose 2.5 % ophthalmic solution Commonly known as: ISOPTO TEARS / GONIOVISC Place 2 drops into both eyes as needed for dry eyes.  Indication: Dry eyes   lisinopril 40 MG tablet Commonly known as: ZESTRIL Take 0.5 tablets (20 mg total) by mouth daily.  Indication: High Blood Pressure Disorder   loratadine 10 MG tablet Commonly known as: CLARITIN Take 1 tablet (10 mg total) by mouth daily. Start taking on: May 18, 2020 What changed:   how much to take  when to take this  Indication: Runny Nose   psyllium 95 % Pack Commonly known as: HYDROCIL/METAMUCIL Take 1 packet by mouth daily. Start taking on: May 18, 2020  Indication: Constipation   traZODone 150 MG tablet Commonly known as: DESYREL Take 0.5 tablets (75 mg total) by mouth at bedtime as needed for sleep.  Indication: Trouble Sleeping       Follow-up Information    [facility].   Contact information: [address] [facility] [place] [address] [phone]        Center[facility].   Specialty: General Practice Why: When I called to get you a hospital follow up appointment, they told me you do not currently have a primary care provider.  That is your portal of entry into the system.  Call eligibility at the above number to get an appointment. Contact information: [address] [place] [address] [phone]  Follow-up recommendations:  Activity:  Activity as tolerated Diet:  Regular diet Other:   [facility]  Comments: Prescriptions provided at discharge  Signed: Mordecai RasmussenJohn Nichole Keltner, MD 05/17/2020, 4:26 PM

## 2020-05-17 NOTE — Progress Notes (Signed)
D: Pt alert and oriented. Pt rates depression 1/10, hopelessness 0/10, and anxiety 2/10.Pt goal: "discharge." Pt reports energy level as normal and concentration as being good. Pt reports sleep last night as being good. Pt did receive medications for sleep and did find them helpful. Pt denies experiencing any pain at this time. Pt denies experiencing any SI/HI, or AVH at this time.   A: Scheduled medications administered to pt, per MD orders. Support and encouragement provided. Frequent verbal contact made. Routine safety checks conducted q15 minutes.   R: No adverse drug reactions noted. Pt verbally contracts for safety at this time. Pt complaint with medications and treatment plan. Pt interacts well with others on the unit. Pt remains safe at this time. Will continue to monitor.  Pt reports having a little bit of anxiety r/t leaving and his neighbors asking where he's been. This writer reassured the pt that he does not have to tell anyone where he's been unless he wants to. This Clinical research associate told the pt he could tell them he went on vacation or just that he wasn't feeling well if he found it necessary to have to them anyone anything. This made the pt feel better. Pt states he is ready to discharge.

## 2020-05-17 NOTE — Progress Notes (Signed)
Belongings (2 shirts, one blue, and one plaid, as well as a pair of boxers) located and will be mailed out to pt.

## 2020-05-17 NOTE — Progress Notes (Addendum)
D: Pt alert and oriented. Pt denies experiencing any pain, SI/HI, or AVH at this time. Pt reports he will be able to keep himself safe when he returns home.   A: Pt received discharge and medication education/information. Pt belongings were returned and signed for at this time to include printed prescriptions from the MD.   R: Pt verbalized understanding of discharge and medication education/information.  Pt escorted by staff to the medical mall front lobby where safe transport picked the pt up to transport him home.  Pt was missing two shirts. They did not come down with him from the ED. Pt stated he was not worried about them. This Clinical research associate still contacted the ED staff requesting they look for the items. The ED never called back. All other items were accounted for and signed for that came down with the pt from the ED.

## 2020-10-21 ENCOUNTER — Other Ambulatory Visit: Payer: Self-pay | Admitting: Psychiatry

## 2022-09-20 NOTE — Progress Notes (Deleted)
New patient visit   Patient: Patrick Hardin   DOB: [birth year]   82 y.o. Male  MRN: [id number] Visit Date: 09/25/2022  Today's healthcare provider: Mardene Speak, PA-C   No chief complaint on file.  Subjective    Patrick Hardin is a 82 y.o. male who presents today as a new patient to establish care.  HPI  ***  Past Medical History:  Diagnosis Date   Depression    Diverticulosis    Hypertension    PUD (peptic ulcer disease)    Past Surgical History:  Procedure Laterality Date   CARDIAC CATHETERIZATION  2006   Stenting at [place] WITH STENT PLACEMENT  2006   HYDROCELE EXCISION / REPAIR  2014   repair. [place] AND ADENOIDECTOMY  1950   Family Status  Relation Name Status   Mother  Deceased       died at age 65, no healthy issues   Father  Deceased   Family History  Problem Relation Age of Onset   Leukemia Father    Social History   Socioeconomic History   Marital status: Widowed    Spouse name: Not on file   Number of children: 2   Years of education: Not on file   Highest education level: Not on file  Occupational History   Not on file  Tobacco Use   Smoking status: Former    Types: Cigarettes   Smokeless tobacco: Never   Tobacco comments:    quit in 1997  Vaping Use   Vaping Use: Never used  Substance and Sexual Activity   Alcohol use: No   Drug use: Yes    Types: Marijuana    Comment: Acid   Sexual activity: Not on file  Other Topics Concern   Not on file  Social History Narrative   Not on file   Social Determinants of Health   Financial Resource Strain: Not on file  Food Insecurity: Not on file  Transportation Needs: Not on file  Physical Activity: Not on file  Stress: Not on file  Social Connections: Not on file   Outpatient Medications Prior to Visit  Medication Sig   aspirin EC 81 MG EC tablet Take 1 tablet (81 mg total) by mouth daily. Swallow  whole.   atorvastatin (LIPITOR) 40 MG tablet Take 1 tablet (40 mg total) by mouth daily at 6 PM.   docusate sodium (COLACE) 100 MG capsule Take 1 capsule (100 mg total) by mouth 2 (two) times daily.   escitalopram (LEXAPRO) 10 MG tablet Take 1 tablet (10 mg total) by mouth daily.   fluticasone (FLONASE) 50 MCG/ACT nasal spray Place 1 spray into both nostrils 2 (two) times daily.   hydroxypropyl methylcellulose / hypromellose (ISOPTO TEARS / GONIOVISC) 2.5 % ophthalmic solution Place 2 drops into both eyes as needed for dry eyes.   lisinopril (ZESTRIL) 40 MG tablet Take 0.5 tablets (20 mg total) by mouth daily.   loratadine (CLARITIN) 10 MG tablet Take 1 tablet (10 mg total) by mouth daily.   psyllium (HYDROCIL/METAMUCIL) 95 % PACK Take 1 packet by mouth daily.   traZODone (DESYREL) 150 MG tablet Take 0.5 tablets (75 mg total) by mouth at bedtime as needed for sleep.   No facility-administered medications prior to visit.   Allergies  Allergen Reactions   Advil [Ibuprofen] Other (See Comments)    Gel Caps; Seizure  Viagra [Sildenafil Citrate] Other (See Comments)    Headaches    Immunization History  Administered Date(s) Administered   Influenza, High Dose Seasonal PF 07/14/2016   Influenza-Unspecified 08/24/2018    Health Maintenance  Topic Date Due   COVID-19 Vaccine (1) Never done   DTaP/Tdap/Td (1 - Tdap) Never done   Zoster Vaccines- Shingrix (1 of 2) Never done   Pneumonia Vaccine 21+ Years old (1 - PCV) Never done   Medicare Annual Wellness (AWV)  12/01/2017   INFLUENZA VACCINE  03/24/2022   HPV VACCINES  Aged Out    Patient Care Team: Birdie Sons, MD as PCP - General (Family Medicine) [facility], Abundio Miu, MD as Referring Physician (Ophthalmology) System, Provider Not In as Consulting Physician  Review of Systems  {Labs  Heme  Chem  Endocrine  Serology  Results Review (optional):23779}   Objective    There were no vitals taken for this visit. {Show  previous vital signs (optional):23777}  Physical Exam ***  Depression Screen    12/01/2016    1:49 PM 07/14/2016    1:55 PM  PHQ 2/9 Scores  PHQ - 2 Score 2 0  PHQ- 9 Score 8 0   No results found for any visits on 09/25/22.  Assessment & Plan     ***  No follow-ups on file.     {provider attestation***:1}   Mardene Speak, PA-C  [place] [phone] (phone) [phone] (fax)  [place]

## 2022-09-25 ENCOUNTER — Ambulatory Visit: Payer: Medicare Other | Admitting: Physician Assistant

## 2023-11-02 ENCOUNTER — Other Ambulatory Visit: Payer: Self-pay

## 2023-11-02 ENCOUNTER — Emergency Department
Admission: EM | Admit: 2023-11-02 | Discharge: 2023-11-03 | Disposition: A | Discharge status: Other Acute Inpatient Hospital | Attending: Emergency Medicine | Admitting: Emergency Medicine

## 2023-11-02 DIAGNOSIS — Z79899 Other long term (current) drug therapy: Secondary | ICD-10-CM | POA: Diagnosis not present

## 2023-11-02 DIAGNOSIS — F1210 Cannabis abuse, uncomplicated: Secondary | ICD-10-CM | POA: Diagnosis not present

## 2023-11-02 DIAGNOSIS — I10 Essential (primary) hypertension: Secondary | ICD-10-CM | POA: Diagnosis not present

## 2023-11-02 DIAGNOSIS — F32A Depression, unspecified: Secondary | ICD-10-CM

## 2023-11-02 DIAGNOSIS — F332 Major depressive disorder, recurrent severe without psychotic features: Secondary | ICD-10-CM | POA: Diagnosis not present

## 2023-11-02 DIAGNOSIS — R45851 Suicidal ideations: Secondary | ICD-10-CM | POA: Diagnosis not present

## 2023-11-02 LAB — COMPREHENSIVE METABOLIC PANEL
ALT: 21 U/L (ref 0–44)
AST: 25 U/L (ref 15–41)
Albumin: 4.1 g/dL (ref 3.5–5.0)
Alkaline Phosphatase: 45 U/L (ref 38–126)
Anion gap: 8 (ref 5–15)
BUN: 16 mg/dL (ref 8–23)
CO2: 27 mmol/L (ref 22–32)
Calcium: 9.4 mg/dL (ref 8.9–10.3)
Chloride: 104 mmol/L (ref 98–111)
Creatinine, Ser: 1.5 mg/dL — ABNORMAL HIGH (ref 0.61–1.24)
GFR, Estimated: 46 mL/min — ABNORMAL LOW (ref >60)
Glucose, Bld: 107 mg/dL — ABNORMAL HIGH (ref 70–99)
Potassium: 4.3 mmol/L (ref 3.5–5.1)
Sodium: 139 mmol/L (ref 135–145)
Total Bilirubin: 1.9 mg/dL — ABNORMAL HIGH (ref 0.0–1.2)
Total Protein: 7.2 g/dL (ref 6.5–8.1)

## 2023-11-02 LAB — URINE DRUG SCREEN, QUALITATIVE (ARMC ONLY)
Amphetamines, Ur Screen: NOT DETECTED
Barbiturates, Ur Screen: NOT DETECTED
Benzodiazepine, Ur Scrn: NOT DETECTED
Cannabinoid 50 Ng, Ur ~~LOC~~: POSITIVE — AB
Cocaine Metabolite,Ur ~~LOC~~: NOT DETECTED
MDMA (Ecstasy)Ur Screen: NOT DETECTED
Methadone Scn, Ur: NOT DETECTED
Opiate, Ur Screen: NOT DETECTED
Phencyclidine (PCP) Ur S: NOT DETECTED
Tricyclic, Ur Screen: NOT DETECTED

## 2023-11-02 LAB — CBC
HCT: 47.4 % (ref 39.0–52.0)
Hemoglobin: 15.6 g/dL (ref 13.0–17.0)
MCH: 30.8 pg (ref 26.0–34.0)
MCHC: 32.9 g/dL (ref 30.0–36.0)
MCV: 93.5 fL (ref 80.0–100.0)
Platelets: 229 10*3/uL (ref 150–400)
RBC: 5.07 MIL/uL (ref 4.22–5.81)
RDW: 13 % (ref 11.5–15.5)
WBC: 7.7 10*3/uL (ref 4.0–10.5)
nRBC: 0 % (ref 0.0–0.2)

## 2023-11-02 LAB — SALICYLATE LEVEL: Salicylate Lvl: <7 mg/dL — ABNORMAL LOW (ref 7.0–30.0)

## 2023-11-02 LAB — ETHANOL: Alcohol, Ethyl (B): <10 mg/dL (ref <10)

## 2023-11-02 LAB — ACETAMINOPHEN LEVEL: Acetaminophen (Tylenol), Serum: <10 ug/mL — ABNORMAL LOW (ref 10–30)
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
## 2023-11-02 NOTE — ED Triage Notes (Signed)
 Patient states he spoke to somebody at the [place] today and expressed concern for suicidal ideations. And the person at the [place] told patient to come to ED. Patient denies plan.

## 2023-11-02 NOTE — Consult Note (Incomplete)
 [facility] Psychiatric Consult Initial  Patient Name: .Patrick Hardin  MRN: [id number]  DOB: 1940/12/31  Consult Order details:  Orders (From admission, onward)     Start     Ordered   11/02/23 1519  CONSULT TO CALL ACT TEAM       Ordering Provider: Minna Antis, MD  Provider:  (Not yet assigned)  Question:  Reason for Consult?  Answer:  Psych consult   11/02/23 1518   11/02/23 1519  IP CONSULT TO PSYCHIATRY       Ordering Provider: Minna Antis, MD  Provider:  (Not yet assigned)  Question Answer Comment  Consult Timeframe ROUTINE - requires response within 24 hours   Reason for Consult? Consult for medication management   Contact phone number where the requesting provider can be reached [phone]      11/02/23 1518   11/02/23 1444  CONSULT TO CALL ACT TEAM       Ordering Provider: Merwyn Katos, MD  Provider:  (Not yet assigned)  Question:  Reason for Consult?  Answer:  Psych   11/02/23 1443             Mode of Visit: Tele-visit Virtual Statement:TELE PSYCHIATRY ATTESTATION & CONSENT As the provider for this telehealth consult, I attest that I verified the patient's identity using two separate identifiers, introduced myself to the patient, provided my credentials, disclosed my location, and performed this encounter via a HIPAA-compliant, real-time, face-to-face, two-way, interactive audio and video platform and with the full consent and agreement of the patient (or guardian as applicable.) Patient physical location: [facility]. Telehealth provider physical location: home office in state of [place].   Video start time:   Video end time:      Psychiatry Consult Evaluation  Service Date: November 02, 2023 LOS:  LOS: 0 days  Chief Complaint Depression SI  Primary Psychiatric Diagnoses  MDD 2.  SI  Assessment  Patrick Hardin is a 83 y.o. male admitted: Presented to the EDfor 11/02/2023  3:01 PM for depression and SI. He carries the  psychiatric diagnoses of depression and has a past medical history of  hypertension and PUD.   His current presentation of depression with suicidal ideation is most consistent with major depressive disorder. He meets criteria for inpatient based on persistent depressed mood, SI and history of depressive symptoms.  Current outpatient psychotropic medications include Trazodone and states" I cannot remember the other medication".   He was compliant with medications prior to admission. On initial examination, patient reports persistent feelings of sadness, hopelessness, worthless, and passive SI for the past 6 to 8 months. Please see plan below for detailed recommendations.   Diagnoses: MDD Active Hospital problems: Active Problems:   * No active hospital problems. *    Plan   ## Psychiatric Medication Recommendations:  ***  ## Medical Decision Making Capacity: Not specifically addressed in this encounter   ## Disposition:-- We recommend inpatient psychiatric hospitalization after medical hospitalization. Patient has been involuntarily committed on 11/02/2023.   ## Behavioral / Environmental: -{CHLmacbehavioralenvironmental2:31847}    ## Safety and Observation Level:  - Based on my clinical evaluation, I estimate the patient to be at moderate risk of self harm in the current setting. - At this time, we recommend  routine. This decision is based on my review of the chart including patient's history and current presentation, interview of the patient, mental status examination, and consideration of suicide risk including evaluating suicidal ideation, plan, intent,  suicidal or self-harm behaviors, risk factors, and protective factors. This judgment is based on our ability to directly address suicide risk, implement suicide prevention strategies, and develop a safety plan while the patient is in the clinical setting. Please contact our team if there is a concern that risk level has changed.  CSSR  Risk Category:C-SSRS RISK CATEGORY: High Risk  Suicide Risk Assessment: Patient has following modifiable risk factors for suicide: access to guns, under treated depression , and social isolation, which we are addressing by an inpatient admission. Patient has following non-modifiable or demographic risk factors for suicide: male gender, history of suicide attempt, and psychiatric hospitalization Patient has the following protective factors against suicide: Cultural, spiritual, or religious beliefs that discourage suicide  Thank you for this consult request. Recommendations have been communicated to the primary team.  We will recommend inpatient at this time.   De Burrs, NP       History of Present Illness  Relevant Aspects of Hospital ED Course:  Admitted on 11/02/2023 for depression and SI.  Patient Report:  Patient states that he was talking to his counselor, named [place], at the [place].  Patient stated he told clear for he wanted to commit suicide and that his life was worth with so while go on.  The patient states the counselor told him to get to the closest hospital as soon as possible.  "That is how I came to be here ".   Psych ROS:  Depression: *** Anxiety:  *** Mania (lifetime and current): *** Psychosis: (lifetime and current): ***  Collateral information:  Contacted *** at *** on ***  Review of Systems  Constitutional: Negative.   HENT: Negative.    Eyes: Negative.   Respiratory: Negative.    Cardiovascular: Negative.   Gastrointestinal: Negative.   Genitourinary: Negative.   Musculoskeletal: Negative.   Skin: Negative.   Neurological: Negative.   Psychiatric/Behavioral:  Positive for depression and suicidal ideas.      Psychiatric and Social History  Psychiatric History:  Information collected from ***  Prev Dx/Sx: *** Current Psych Provider: *** Home Meds (current): *** Previous Med Trials: *** Therapy: ***  Prior Psych Hospitalization: ***   Prior Self Harm: *** Prior Violence: ***  Family Psych History: *** Family Hx suicide: ***  Social History:  Developmental Hx: *** Educational Hx: *** Occupational Hx: *** Legal Hx: *** Living Situation: *** Spiritual Hx: *** Access to weapons/lethal means: ***   Substance History Alcohol: ***  Type of alcohol *** Last Drink *** Number of drinks per day *** History of alcohol withdrawal seizures *** History of DT's *** Tobacco: *** Illicit drugs: *** Prescription drug abuse: *** Rehab hx: ***  Exam Findings  Physical Exam: *** Vital Signs:  Temp:  [97.8 F (36.6 C)] 97.8 F (36.6 C) (03/11 1439) Pulse Rate:  [90] 90 (03/11 1439) Resp:  [18] 18 (03/11 1439) BP: (100)/(71) 100/71 (03/11 1439) SpO2:  [93 %] 93 % (03/11 1439) Weight:  [90.7 kg] 90.7 kg (03/11 1440) Blood pressure 100/71, pulse 90, temperature 97.8 F (36.6 C), temperature source Oral, resp. rate 18, height 5\' 11"  (1.803 m), weight 90.7 kg, SpO2 93%. Body mass index is 27.89 kg/m.  Physical Exam HENT:     Head: Normocephalic.     Nose: Nose normal.     Mouth/Throat:     Pharynx: Oropharynx is clear.  Eyes:     Extraocular Movements: Extraocular movements intact.  Pulmonary:     Effort: Pulmonary effort is  normal.  Musculoskeletal:        General: Normal range of motion.     Cervical back: Normal range of motion.  Skin:    General: Skin is dry.  Neurological:     General: No focal deficit present.     Mental Status: He is alert. Mental status is at baseline.     Mental Status Exam: General Appearance: {Appearance:22683}  Orientation:  {BHH ORIENTATION (PAA):22689}  Memory:  {BHH MEMORY:22881}  Concentration:  {Concentration:21399}  Recall:  {BHH GOOD/FAIR/POOR:22877}  Attention  {BH Attention Span:31825}  Eye Contact:  {BHH EYE CONTACT:22684}  Speech:  {Speech:22685}  Language:  {BHH GOOD/FAIR/POOR:22877}  Volume:  {Volume (PAA):22686}  Mood: ***  Affect:  {Affect (PAA):22687}   Thought Process:  {Thought Process (PAA):22688}  Thought Content:  {Thought Content:22690}  Suicidal Thoughts:  {ST/HT (PAA):22692}  Homicidal Thoughts:  {ST/HT (PAA):22692}  Judgement:  {Judgement (PAA):22694}  Insight:  {Insight (PAA):22695}  Psychomotor Activity:  {Psychomotor (PAA):22696}  Akathisia:  {BHH YES OR NO:22294}  Fund of Knowledge:  {BHH GOOD/FAIR/POOR:22877}      Assets:  {Assets (PAA):22698}  Cognition:  {chl bhh cognition:304700322}  ADL's:  {BHH NWG'N:56213}  AIMS (if indicated):        Other History   These have been pulled in through the EMR, reviewed, and updated if appropriate.  Family History:  The patient's family history includes Leukemia in his father.  Medical History: Past Medical History:  Diagnosis Date  . Depression   . Diverticulosis   . Hypertension   . PUD (peptic ulcer disease)     Surgical History: Past Surgical History:  Procedure Laterality Date  . CARDIAC CATHETERIZATION  2006   [facility]enting at [facility]  . CATARACT EXTRACTION W/ INTRAOCULAR LENS IMPLANT    . CORONARY ANGIOPLASTY WITH STENT PLACEMENT  2006  . HYDROCELE EXCISION / REPAIR  2014   repair. [facility]  . TONSILLECTOMY AND ADENOIDECTOMY  1950     Medications:  No current facility-administered medications for this encounter.  Current Outpatient Medications:  .  aspirin EC 81 MG EC tablet, Take 1 tablet (81 mg total) by mouth daily. Swallow whole., Disp: 30 tablet, Rfl: 1 .  atorvastatin (LIPITOR) 40 MG tablet, Take 1 tablet (40 mg total) by mouth daily at 6 PM., Disp: 30 tablet, Rfl: 1 .  docusate sodium (COLACE) 100 MG capsule, Take 1 capsule (100 mg total) by mouth 2 (two) times daily., Disp: 60 capsule, Rfl: 1 .  escitalopram (LEXAPRO) 10 MG tablet, Take 1 tablet (10 mg total) by mouth daily., Disp: 30 tablet, Rfl: 1 .  fluticasone (FLONASE) 50 MCG/ACT nasal spray, Place 1 spray into both nostrils 2 (two) times daily., Disp: 16 g, Rfl: 1 .  hydroxypropyl methylcellulose /  hypromellose (ISOPTO TEARS / GONIOVISC) 2.5 % ophthalmic solution, Place 2 drops into both eyes as needed for dry eyes., Disp: 15 mL, Rfl: 1 .  lisinopril (ZESTRIL) 40 MG tablet, Take 0.5 tablets (20 mg total) by mouth daily., Disp: 15 tablet, Rfl: 1 .  loratadine (CLARITIN) 10 MG tablet, Take 1 tablet (10 mg total) by mouth daily., Disp: 30 tablet, Rfl: 1 .  psyllium (HYDROCIL/METAMUCIL) 95 % PACK, Take 1 packet by mouth daily., Disp: 30 each, Rfl: 1 .  traZODone (DESYREL) 150 MG tablet, Take 0.5 tablets (75 mg total) by mouth at bedtime as needed for sleep., Disp: 15 tablet, Rfl: 1  Allergies: Allergies  Allergen Reactions  . Advil [Ibuprofen] Other (See Comments)    Gel Caps;  Seizure  . Viagra [Sildenafil Citrate] Other (See Comments)    Headaches    De Burrs, NP

## 2023-11-02 NOTE — BH Assessment (Signed)
 Comprehensive Clinical Assessment (CCA) Note  11/02/2023 Patrick Hardin [id number] Recommendations for Services/Supports/Treatments: Psych NP Patrick Hardin. determined pt. meets psychiatric inpatient criteria. Patrick Hardin is an 83 year old, English speaking, Caucasian male with a hx of Major Depression and Cannabis abuse. Per triage note: Patient states he spoke to somebody at the [place] today and expressed concern for suicidal ideations. And the person at the [place] told patient to come to ED. Patient denies plan.  Pt presented with, "I called my counselor at the [place] today and told him I was having thoughts of committing suicide" when asked what brought him to the hospital. Upon assessment, pt. was forthcoming and expansive. Pt endorsed feeling depressed, hopeless, and stressed. Pt reported that he has no reason to live and is all alone. Pt endorsed passive suicidal ideation without intent or actually wanting to die, explaining that he just wants to feel better. Pt reported that he does not want to live it has to be with the depressive symptoms. Pt explained that his main support is his [facility] provider, Patrick Dusky, NP. Pt admitted to smoking some type of cannabinoid/hemp/delta 8 about 1 month ago in an attempt to self-medicate. The pt. had poor insight and dangerous judgement. Pt did not appear to be responding to internal or external stimuli. Pt presented with clear speech and had linear thought processes. Pt had normal eye contact. Pt presented with a depressed mood; affect was congruent. Pt denied current HI/AV/H.  Flowsheet Row ED from 11/02/2023 in [facility] Emergency Department at [facility] Admission (Discharged) from 05/09/2020 in [facility] INPATIENT BEHAVIORAL MEDICINE ED from 05/07/2020 in [facility] Emergency Department at [facility]  C-SSRS RISK CATEGORY High Risk No Risk High Risk       Chief Complaint:  Chief Complaint  Patient presents with   Psychiatric Evaluation   Visit Diagnosis:  Severe recurrent major depression (HCC) Active Problems:   Hypertension   Cannabis abuse  CCA Screening, Triage and Referral (STR)  Patient Reported Information How did you hear about [place]? No data recorded Referral name: No data recorded Referral phone number: No data recorded  Whom do you see for routine medical problems? No data recorded Practice/Facility Name: No data recorded Practice/Facility Phone Number: No data recorded Name of Contact: No data recorded Contact Number: No data recorded Contact Fax Number: No data recorded Prescriber Name: No data recorded Prescriber Address (if known): No data recorded  What Is the Reason for Your Visit/Call Today? No data recorded How Long Has This Been Causing You Problems? No data recorded What Do You Feel Would Help You the Most Today? No data recorded  Have You Recently Been in Any Inpatient Treatment (Hospital/Detox/Crisis Center/28-Day Program)? No data recorded Name/Location of Program/Hospital:No data recorded How Long Were You There? No data recorded When Were You Discharged? No data recorded  Have You Ever Received Services From [facility] Before? No data recorded Who Do You See at [facility]? No data recorded  Have You Recently Had Any Thoughts About Hurting Yourself? No data recorded Are You Planning to Commit Suicide/Harm Yourself At This time? No data recorded  Have you Recently Had Thoughts About Hurting Someone Karolee Ohs? No data recorded Explanation: No data recorded  Have You Used Any Alcohol or Drugs in the Past 24 Hours? No data recorded How Long Ago Did You Use Drugs or Alcohol? No data recorded What Did You Use and How Much? No data recorded  Do You Currently Have a Therapist/Psychiatrist? No data recorded  Name of Therapist/Psychiatrist: No data recorded  Have You Been Recently Discharged From Any Office Practice or Programs? No data recorded Explanation of Discharge From Practice/Program: No data  recorded    CCA Screening Triage Referral Assessment Type of Contact: No data recorded Is this Initial or Reassessment? No data recorded Date Telepsych consult ordered in [facility]:  No data recorded Time Telepsych consult ordered in [facility]:  No data recorded  Patient Reported Information Reviewed? No data recorded Patient Left Without Being Seen? No data recorded Reason for Not Completing Assessment: No data recorded  Collateral Involvement: No data recorded  Does Patient Have a Court Appointed Legal Guardian? No data recorded Name and Contact of Legal Guardian: No data recorded If Minor and Not Living with Parent(s), Who has Custody? No data recorded Is CPS involved or ever been involved? No data recorded Is APS involved or ever been involved? No data recorded  Patient Determined To Be At Risk for Harm To Self or Others Based on Review of Patient Reported Information or Presenting Complaint? No data recorded Method: No data recorded Availability of Means: No data recorded Intent: No data recorded Notification Required: No data recorded Additional Information for Danger to Others Potential: No data recorded Additional Comments for Danger to Others Potential: No data recorded Are There Guns or Other Weapons in Your Home? No data recorded Types of Guns/Weapons: No data recorded Are These Weapons Safely Secured?                            No data recorded Who Could Verify You Are Hardin To Have These Secured: No data recorded Do You Have any Outstanding Charges, Pending Court Dates, Parole/Probation? No data recorded Contacted To Inform of Risk of Harm To Self or Others: No data recorded  Location of Assessment: No data recorded  Does Patient Present under Involuntary Commitment? No data recorded IVC Papers Initial File Date: No data recorded  [place] of Residence: No data recorded  Patient Currently Receiving the Following Services: No data recorded  Determination of Need: No data  recorded  Options For Referral: No data recorded    CCA Biopsychosocial Intake/Chief Complaint:  No data recorded Current Symptoms/Problems: No data recorded  Patient Reported Schizophrenia/Schizoaffective Diagnosis in Past: No data recorded  Strengths: No data recorded Preferences: No data recorded Abilities: No data recorded  Type of Services Patient Feels are Needed: No data recorded  Initial Clinical Notes/Concerns: No data recorded  Mental Health Symptoms Depression:  No data recorded  Duration of Depressive symptoms: No data recorded  Mania:  No data recorded  Anxiety:   No data recorded  Psychosis:  No data recorded  Duration of Psychotic symptoms: No data recorded  Trauma:  No data recorded  Obsessions:  No data recorded  Compulsions:  No data recorded  Inattention:  No data recorded  Hyperactivity/Impulsivity:  No data recorded  Oppositional/Defiant Behaviors:  No data recorded  Emotional Irregularity:  No data recorded  Other Mood/Personality Symptoms:  No data recorded   Mental Status Exam Appearance and self-care  Stature:  No data recorded  Weight:  No data recorded  Clothing:  No data recorded  Grooming:  No data recorded  Cosmetic use:  No data recorded  Posture/gait:  No data recorded  Motor activity:  No data recorded  Sensorium  Attention:  No data recorded  Concentration:  No data recorded  Orientation:  No data recorded  Recall/memory:  No  data recorded  Affect and Mood  Affect:  No data recorded  Mood:  No data recorded  Relating  Eye contact:  No data recorded  Facial expression:  No data recorded  Attitude toward examiner:  No data recorded  Thought and Language  Speech flow: No data recorded  Thought content:  No data recorded  Preoccupation:  No data recorded  Hallucinations:  No data recorded  Organization:  No data recorded  [employer] of Knowledge:  No data recorded  Intelligence:  No data recorded   Abstraction:  No data recorded  Judgement:  No data recorded  Reality Testing:  No data recorded  Insight:  No data recorded  Decision Making:  No data recorded  Social Functioning  Social Maturity:  No data recorded  Social Judgement:  No data recorded  Stress  Stressors:  No data recorded  Coping Ability:  No data recorded  Skill Deficits:  No data recorded  Supports:  No data recorded    Religion:    Leisure/Recreation:    Exercise/Diet:     CCA Employment/Education Employment/Work Situation:    Education:     CCA Family/Childhood History Family and Relationship History:    Childhood History:     Child/Adolescent Assessment:     CCA Substance Use Alcohol/Drug Use:                           ASAM's:  Six Dimensions of Multidimensional Assessment  Dimension 1:  Acute Intoxication and/or Withdrawal Potential:      Dimension 2:  Biomedical Conditions and Complications:      Dimension 3:  Emotional, Behavioral, or Cognitive Conditions and Complications:     Dimension 4:  Readiness to Change:     Dimension 5:  Relapse, Continued use, or Continued Problem Potential:     Dimension 6:  Recovery/Living Environment:     ASAM Severity Score:    ASAM Recommended Level of Treatment:     Substance use Disorder (SUD)    Recommendations for Services/Supports/Treatments:    DSM5 Diagnoses: Patient Active Problem List   Diagnosis Date Noted   Severe recurrent major depression (HCC) 05/09/2020   Cannabis abuse 05/09/2020   Bowel habit changes 07/13/2016   Depression 07/13/2016   Diverticulosis 07/13/2016   Erectile dysfunction 07/13/2016   Hypertension 07/13/2016   Peptic ulcer disease 07/13/2016    Patient Centered Plan: Patient is on the following Treatment Plan(s):  Depression  Telesforo Brosnahan R Louana Fontenot, LCAS

## 2023-11-02 NOTE — ED Notes (Signed)
 IVC PENDING  CONSULT ?

## 2023-11-02 NOTE — BH Assessment (Signed)
 This Clinical research associate spoke with [employer] representative Verdon Cummins [phone]) about bed availability. Verdon Cummins advised this Clinical research associate to send the referral after 8 AM 11/02/23, as referrals are not reviewed this late in the evening.

## 2023-11-02 NOTE — ED Notes (Addendum)
 Marland Kitchen

## 2023-11-02 NOTE — ED Provider Notes (Signed)
 [facility] Provider Note    Event Date/Time   First MD Initiated Contact with Patient 11/02/23 1509     (approximate)  History   Chief Complaint: Psychiatric Evaluation  HPI  Patrick Hardin is a 83 y.o. male with a past medical history of depression, hypertension, presents to the emergency department for worsening depression and suicidal ideation.  According to the patient he has a history of depression, he has attempted suicide once previously, states he almost shot himself in the head but at the last minute turned the gun (this was on a previous occasion).  Patient states over the last 1 month he has been experiencing worsening depression, states he has not left his apartment in nearly 1 month.  He states he was talking to one of his [facility] counselors and he had mentioned that he is having worsening thoughts of killing himself and they recommended that he come to the emergency department for evaluation.  Patient denies any drugs or alcohol use besides occasional delta 8 use which he states has not really helped or hurt the depression.  Physical Exam   Triage Vital Signs: ED Triage Vitals  Encounter Vitals Group     BP 11/02/23 1439 100/71     Systolic BP Percentile --      Diastolic BP Percentile --      Pulse Rate 11/02/23 1439 90     Resp 11/02/23 1439 18     Temp 11/02/23 1439 97.8 F (36.6 C)     Temp Source 11/02/23 1439 Oral     SpO2 11/02/23 1439 93 %     Weight 11/02/23 1440 200 lb (90.7 kg)     Height 11/02/23 1440 5\' 11"  (1.803 m)     Head Circumference --      Peak Flow --      Pain Score 11/02/23 1439 0     Pain Loc --      Pain Education --      Exclude from Growth Chart --     Most recent vital signs: Vitals:   11/02/23 1439  BP: 100/71  Pulse: 90  Resp: 18  Temp: 97.8 F (36.6 C)  SpO2: 93%    General: Awake, no distress.  CV:  Good peripheral perfusion.  Regular rate and rhythm  Resp:  Normal effort.  Equal breath sounds  bilaterally.  Abd:  No distention.  Soft, nontender.  No rebound or guarding.  ED Results / Procedures / Treatments   MEDICATIONS ORDERED IN ED: Medications - No data to display   IMPRESSION / MDM / ASSESSMENT AND PLAN / ED COURSE  I reviewed the triage vital signs and the nursing notes.  Patient's presentation is most consistent with acute presentation with potential threat to life or bodily function.  Patient presents to the emergency department for worsening depression and thoughts of killing self.  Given the patient's history of a suicide attempt with worsening thoughts of killing himself will place the patient under an involuntary commitment.  Patient has no medical complaints today.  Patient CBC shows normal results, chemistry shows no concerning findings mild renal insufficiency, ethanol salicylate and acetaminophen levels are negative, urine drug screen is positive for cannabinoids only.  Patient is medically cleared awaiting psychiatric evaluation for disposition.  Remains under IVC.  FINAL CLINICAL IMPRESSION(S) / ED DIAGNOSES   Suicidal ideation, depression  Note:  This document was prepared using Dragon voice recognition software and may include unintentional dictation errors.  Minna Antis, MD 11/02/23 1626

## 2023-11-02 NOTE — ED Notes (Addendum)
 Belongings placed in patient belonging bag: Black jacket Black Engineer, mining [employer] Eye glasses Black iphone Oncologist Black hat

## 2023-11-03 LAB — RESP PANEL BY RT-PCR (RSV, FLU A&B, COVID)  RVPGX2
Influenza A by PCR: NEGATIVE
Influenza B by PCR: NEGATIVE
Resp Syncytial Virus by PCR: NEGATIVE
SARS Coronavirus 2 by RT PCR: NEGATIVE

## 2023-11-03 MED ORDER — ACETAMINOPHEN 500 MG PO TABS
1000.0000 mg | ORAL_TABLET | Freq: Once | ORAL | Status: AC
  Administered 2023-11-03: 1000 mg via ORAL
  Filled 2023-11-03: qty 2

## 2023-11-03 NOTE — ED Notes (Signed)
 IVC / Recommend inpatient psych hospitalization after medical

## 2023-11-03 NOTE — BH Assessment (Addendum)
 Patient has been accepted to [facility] on today 11/03/23 before 9:30pm.  Patient assigned to Emergency Department. Accepting physician is Dr. Clemetine Marker.  Call report to [phone] ext [phone] or ext [phone].  Representative was [employer].   ER Staff is aware of it:  Misty Stanley, ER Secretary  Dr. Larinda Buttery, ER MD  Connye Burkitt, Patient's Nurse     Patient's Family/Support System Marin Olp [phone]) has been updated as well.

## 2023-11-03 NOTE — ED Notes (Signed)
 EMTALA Reviewed by this RN.

## 2023-11-03 NOTE — BH Assessment (Signed)
 Ref-faxed referral paperwork attention to [place] at [facility] [phone].

## 2023-11-03 NOTE — ED Notes (Signed)
 Attempted to call report at this time to [facility]. Put on hold for 12 minutes+

## 2023-11-03 NOTE — ED Provider Notes (Signed)
 Emergency Medicine Observation Re-evaluation Note  Patrick Hardin is a 83 y.o. male, seen on rounds today.  Pt initially presented to the ED for complaints of Psychiatric Evaluation Currently, the patient is resting comfortably.  Physical Exam  BP 127/74 (BP Location: Right Arm)   Pulse (!) 58   Temp 98.4 F (36.9 C) (Oral)   Resp 18   Ht 5\' 11"  (1.803 m)   Wt 90.7 kg   SpO2 93%   BMI 27.89 kg/m  Physical Exam General: No acute distress  ED Course / MDM  EKG:   I have reviewed the labs performed to date as well as medications administered while in observation.  Recent changes in the last 24 hours include no acute events.  Plan  Current plan is for inpatient psychiatric hospitalization recommended by psychiatry.    Janith Lima, MD 11/03/23 [phone]

## 2023-11-03 NOTE — ED Notes (Signed)
 Patient belongings given to [facility] transporting patient to [place].

## 2023-11-03 NOTE — ED Notes (Signed)
 Breakfast provided.

## 2023-11-03 NOTE — BH Assessment (Signed)
 Faxed Transfer paperwork (signed by physician and patient) attention to: Evaristo Bury [phone].

## 2023-11-03 NOTE — ED Notes (Addendum)
 [facility] laid eyes on patient.  Patient was calm, alert and maintained good eye contact during visit.  Patient engaged well throughout conversation with [facility].  Patient confirmed that he can ambulate well and perform all ADLs without assistance.  Patient shared he was advised earlier that he would be going to [facility] for inpatient treatment. Patient has a positive attitude toward having inpatient treatment and stated he is looking forward to getting back to his normal life after treatment.  Patient shared how his experiences at [facility] have all been positive.  Patient reported that his main insurance is through the [place] and has [employer] for additional coverage.  Graylon Good, [facility]

## 2023-11-03 NOTE — BH Assessment (Signed)
 Adult/GERO MH  Referral information for Psychiatric Hospitalization faxed to:  [facility] [phone])

## 2023-11-03 NOTE — ED Notes (Signed)
 Gave report to Lucretia Field at [facility] at this time.

## 2023-11-03 NOTE — ED Notes (Signed)
 [facility]  [facility]ER[facility]  DEPT  CALLED  FOR TRANSPORT TO  [place]  [facility]

## 2023-11-03 NOTE — ED Notes (Signed)
 Lunch provided.

## 2024-03-03 ENCOUNTER — Emergency Department
Admission: EM | Admit: 2024-03-03 | Discharge: 2024-03-03 | Disposition: A | Discharge status: Home / Self Care | Attending: Emergency Medicine | Admitting: Emergency Medicine

## 2024-03-03 DIAGNOSIS — N4889 Other specified disorders of penis: Secondary | ICD-10-CM | POA: Diagnosis not present

## 2024-03-03 DIAGNOSIS — R339 Retention of urine, unspecified: Secondary | ICD-10-CM | POA: Diagnosis present

## 2024-03-03 DIAGNOSIS — R338 Other retention of urine: Secondary | ICD-10-CM

## 2024-03-03 LAB — CBC WITH DIFFERENTIAL/PLATELET
Abs Immature Granulocytes: 0.04 K/uL (ref 0.00–0.07)
Basophils Absolute: 0 K/uL (ref 0.0–0.1)
Basophils Relative: 1 %
Eosinophils Absolute: 0 K/uL (ref 0.0–0.5)
Eosinophils Relative: 0 %
HCT: 40.7 % (ref 39.0–52.0)
Hemoglobin: 13.5 g/dL (ref 13.0–17.0)
Immature Granulocytes: 1 %
Lymphocytes Relative: 14 %
Lymphs Abs: 1.2 K/uL (ref 0.7–4.0)
MCH: 31.3 pg (ref 26.0–34.0)
MCHC: 33.2 g/dL (ref 30.0–36.0)
MCV: 94.2 fL (ref 80.0–100.0)
Monocytes Absolute: 0.6 K/uL (ref 0.1–1.0)
Monocytes Relative: 7 %
Neutro Abs: 6.7 K/uL (ref 1.7–7.7)
Neutrophils Relative %: 77 %
Platelets: 235 K/uL (ref 150–400)
RBC: 4.32 MIL/uL (ref 4.22–5.81)
RDW: 13.2 % (ref 11.5–15.5)
WBC: 8.5 K/uL (ref 4.0–10.5)
nRBC: 0 % (ref 0.0–0.2)

## 2024-03-03 LAB — COMPREHENSIVE METABOLIC PANEL WITH GFR
ALT: 23 U/L (ref 0–44)
AST: 36 U/L (ref 15–41)
Albumin: 3.6 g/dL (ref 3.5–5.0)
Alkaline Phosphatase: 55 U/L (ref 38–126)
Anion gap: 12 (ref 5–15)
BUN: 12 mg/dL (ref 8–23)
CO2: 21 mmol/L — ABNORMAL LOW (ref 22–32)
Calcium: 8.6 mg/dL — ABNORMAL LOW (ref 8.9–10.3)
Chloride: 100 mmol/L (ref 98–111)
Creatinine, Ser: 1.22 mg/dL (ref 0.61–1.24)
GFR, Estimated: 59 mL/min — ABNORMAL LOW (ref >60)
Glucose, Bld: 123 mg/dL — ABNORMAL HIGH (ref 70–99)
Potassium: 4.1 mmol/L (ref 3.5–5.1)
Sodium: 133 mmol/L — ABNORMAL LOW (ref 135–145)
Total Bilirubin: 1 mg/dL (ref 0.0–1.2)
Total Protein: 6.6 g/dL (ref 6.5–8.1)

## 2024-03-03 LAB — URINALYSIS, ROUTINE W REFLEX MICROSCOPIC
Bilirubin Urine: NEGATIVE
Glucose, UA: 50 mg/dL — AB
Hgb urine dipstick: NEGATIVE
Ketones, ur: NEGATIVE mg/dL
Nitrite: NEGATIVE
Protein, ur: NEGATIVE mg/dL
Specific Gravity, Urine: 1.019 (ref 1.005–1.030)
pH: 5 (ref 5.0–8.0)

## 2024-03-03 MED ORDER — IBUPROFEN 600 MG PO TABS
600.0000 mg | ORAL_TABLET | Freq: Once | ORAL | Status: AC
  Administered 2024-03-03: 600 mg via ORAL
  Filled 2024-03-03: qty 1

## 2024-03-03 MED ORDER — OXYCODONE-ACETAMINOPHEN 5-325 MG PO TABS
1.0000 | ORAL_TABLET | Freq: Once | ORAL | Status: AC
  Administered 2024-03-03: 1 via ORAL
  Filled 2024-03-03: qty 1

## 2024-03-03 NOTE — ED Triage Notes (Addendum)
 Pt c/o penile swelling starting last night.  Pain score 8/10.  Pt reports swelling is causing dysuria.  Pt denies prostate issues.

## 2024-03-03 NOTE — ED Provider Notes (Signed)
 [facility] Provider Note    Event Date/Time   First MD Initiated Contact with Patient 03/03/24 1619     (approximate)   History   Groin Swelling   HPI Patrick Hardin is a 83 y.o. male presenting today for penile pain.  Patient stated he noticed swelling in his penis starting last night.  Feels pain throughout the penis and having difficult time urinating.  No prior history of prostate issues.  Does have implantable penile pump but does not think it is inflated at this time.  Denies any other abdominal pain, nausea, vomiting, constipation, diarrhea.  No prior issues with the penile pump in the past.  Denies any redness to his penis or groin.     Physical Exam   Triage Vital Signs: ED Triage Vitals  Encounter Vitals Group     BP 03/03/24 1426 (!) 154/83     Girls Systolic BP Percentile --      Girls Diastolic BP Percentile --      Boys Systolic BP Percentile --      Boys Diastolic BP Percentile --      Pulse Rate 03/03/24 1426 70     Resp 03/03/24 1426 18     Temp 03/03/24 1426 98.5 F (36.9 C)     Temp Source 03/03/24 1426 Oral     SpO2 03/03/24 1426 94 %     Weight 03/03/24 1427 200 lb (90.7 kg)     Height 03/03/24 1427 5' 11 (1.803 m)     Head Circumference --      Peak Flow --      Pain Score 03/03/24 1427 8     Pain Loc --      Pain Education --      Exclude from Growth Chart --     Most recent vital signs: Vitals:   03/03/24 1426  BP: (!) 154/83  Pulse: 70  Resp: 18  Temp: 98.5 F (36.9 C)  SpO2: 94%   I have reviewed the vital signs. General:  Awake, alert, no acute distress. Head:  Normocephalic, Atraumatic. EENT:  PERRL, EOMI, Oral mucosa pink and moist, Neck is supple. Cardiovascular: Regular rate, 2+ distal pulses. Respiratory:  Normal respiratory effort, symmetrical expansion, no distress.  Abdomen: Soft, nontender, nondistended GU: Penis does not appear significantly engorged and no tenderness to palpation or erythema.   There is firmness to the penis but unable to fully differentiate with patient whether this is baseline or when the pump is inflated.  No tenderness to palpation, swelling, or erythema to scrotum. Extremities:  Moving all four extremities through full ROM without pain.   Neuro:  Alert and oriented.  Interacting appropriately.   Skin:  Warm, dry, no rash.   Psych: Appropriate affect.    ED Results / Procedures / Treatments   Labs (all labs ordered are listed, but only abnormal results are displayed) Labs Reviewed  URINALYSIS, ROUTINE W REFLEX MICROSCOPIC - Abnormal; Notable for the following components:      Result Value   Color, Urine YELLOW (*)    APPearance HAZY (*)    Glucose, UA 50 (*)    Leukocytes,Ua TRACE (*)    Bacteria, UA RARE (*)    All other components within normal limits  COMPREHENSIVE METABOLIC PANEL WITH GFR - Abnormal; Notable for the following components:   Sodium 133 (*)    CO2 21 (*)    Glucose, Bld 123 (*)    Calcium  8.6 (*)  GFR, Estimated 59 (*)    All other components within normal limits  CBC WITH DIFFERENTIAL/PLATELET     EKG    RADIOLOGY    PROCEDURES:  Critical Care performed: No  Procedures   MEDICATIONS ORDERED IN ED: Medications  ibuprofen  (ADVIL ) tablet 600 mg (600 mg Oral Given 03/03/24 1514)  oxyCODONE -acetaminophen  (PERCOCET/ROXICET) 5-325 MG per tablet 1 tablet (1 tablet Oral Given 03/03/24 1710)     IMPRESSION / MDM / ASSESSMENT AND PLAN / ED COURSE  I reviewed the triage vital signs and the nursing notes.                              Differential diagnosis includes, but is not limited to, acute urinary retention, BPH, acute cystitis, problem secondary to penile implant  Patient's presentation is most consistent with acute complicated illness / injury requiring diagnostic workup.  Patient is an 83 year old male presenting today for lower abdominal pain and inability to void.  Laboratory workup shows large unremarkable  CBC, CMP.  UA without signs of infection.  Patient was found to have notably distended bladder on bladder scan.  Even postvoid residual is still greater than 500.  We were successfully able to place a Foley catheter and so I have much lower suspicion for issues with his penile implant as I suspect if this was obstructing, then we would not of been able to pass a Foley catheter.  He noted almost complete resolution of symptoms following this and with 1100 cc in urine out.  Suspect more likely enlarged prostate as source of retention.  Will discharge with Foley catheter in place and urology follow-up.  Given strict return precautions.     FINAL CLINICAL IMPRESSION(S) / ED DIAGNOSES   Final diagnoses:  Acute urinary retention     Rx / DC Orders   ED Discharge Orders          Ordered    Ambulatory referral to Urology        03/03/24 1749             Note:  This document was prepared using Dragon voice recognition software and may include unintentional dictation errors.   Malvina Alm DASEN, MD 03/03/24 1750

## 2024-03-03 NOTE — ED Provider Triage Note (Cosign Needed Addendum)
 Emergency Medicine Provider Triage Evaluation Note  Patrick Hardin , a 83 y.o. male  was evaluated in triage.  Pt complains of penile swelling, dysuria, urinary retention. Denies prostate issues. Denies groin or testicular swelling.    Physical Exam  BP (!) 154/83 (BP Location: Left Arm)   Pulse 70   Temp 98.5 F (36.9 C) (Oral)   Resp 18   Ht 5' 11 (1.803 m)   Wt 90.7 kg   SpO2 94%   BMI 27.89 kg/m  Gen:   Awake, no distress   Resp:  Normal effort  MSK:   Moves extremities without difficulty  Other:    Medical Decision Making  Medically screening exam initiated at 2:36 PM.  Appropriate orders placed.  Patrick Hardin was informed that the remainder of the evaluation will be completed by another provider, this initial triage assessment does not replace that evaluation, and the importance of remaining in the ED until their evaluation is complete.  CBC, CMP, Bladder scan, UA   Sheron Salm, PA-C 03/03/24 1437    Sheron Ball, [place] 03/03/24 1604

## 2024-03-03 NOTE — Discharge Instructions (Signed)
 Please follow-up with urology as planned.  If you have not heard anything by Tuesday, please give their clinic a call as I have placed a referral.  Please return for any severe or worsening symptoms and maintain the Foley catheter in place until you follow-up with them.

## 2024-03-17 ENCOUNTER — Encounter: Payer: Self-pay | Admitting: Emergency Medicine

## 2024-03-17 ENCOUNTER — Emergency Department: Admission: EM | Admit: 2024-03-17 | Discharge: 2024-03-17 | Disposition: A | Discharge status: Home / Self Care

## 2024-03-17 ENCOUNTER — Other Ambulatory Visit: Payer: Self-pay

## 2024-03-17 DIAGNOSIS — R339 Retention of urine, unspecified: Secondary | ICD-10-CM | POA: Diagnosis not present

## 2024-03-17 DIAGNOSIS — N390 Urinary tract infection, site not specified: Secondary | ICD-10-CM | POA: Diagnosis not present

## 2024-03-17 DIAGNOSIS — R1030 Lower abdominal pain, unspecified: Secondary | ICD-10-CM | POA: Diagnosis present

## 2024-03-17 LAB — CBC WITH DIFFERENTIAL/PLATELET
Abs Immature Granulocytes: 0.05 K/uL (ref 0.00–0.07)
Basophils Absolute: 0.1 K/uL (ref 0.0–0.1)
Basophils Relative: 1 %
Eosinophils Absolute: 0 K/uL (ref 0.0–0.5)
Eosinophils Relative: 0 %
HCT: 44.1 % (ref 39.0–52.0)
Hemoglobin: 14.7 g/dL (ref 13.0–17.0)
Immature Granulocytes: 1 %
Lymphocytes Relative: 16 %
Lymphs Abs: 1.7 K/uL (ref 0.7–4.0)
MCH: 31.1 pg (ref 26.0–34.0)
MCHC: 33.3 g/dL (ref 30.0–36.0)
MCV: 93.2 fL (ref 80.0–100.0)
Monocytes Absolute: 0.7 K/uL (ref 0.1–1.0)
Monocytes Relative: 7 %
Neutro Abs: 7.9 K/uL — ABNORMAL HIGH (ref 1.7–7.7)
Neutrophils Relative %: 75 %
Platelets: 359 K/uL (ref 150–400)
RBC: 4.73 MIL/uL (ref 4.22–5.81)
RDW: 12.9 % (ref 11.5–15.5)
WBC: 10.5 K/uL (ref 4.0–10.5)
nRBC: 0 % (ref 0.0–0.2)

## 2024-03-17 LAB — BASIC METABOLIC PANEL WITH GFR
Anion gap: 11 (ref 5–15)
BUN: 18 mg/dL (ref 8–23)
CO2: 22 mmol/L (ref 22–32)
Calcium: 9.2 mg/dL (ref 8.9–10.3)
Chloride: 102 mmol/L (ref 98–111)
Creatinine, Ser: 1.37 mg/dL — ABNORMAL HIGH (ref 0.61–1.24)
GFR, Estimated: 52 mL/min — ABNORMAL LOW (ref >60)
Glucose, Bld: 121 mg/dL — ABNORMAL HIGH (ref 70–99)
Potassium: 3.8 mmol/L (ref 3.5–5.1)
Sodium: 135 mmol/L (ref 135–145)

## 2024-03-17 LAB — URINALYSIS, ROUTINE W REFLEX MICROSCOPIC
Bilirubin Urine: NEGATIVE
Glucose, UA: NEGATIVE mg/dL
Hgb urine dipstick: NEGATIVE
Ketones, ur: NEGATIVE mg/dL
Nitrite: NEGATIVE
Protein, ur: 30 mg/dL — AB
Specific Gravity, Urine: 1.021 (ref 1.005–1.030)
WBC, UA: >50 WBC/hpf (ref 0–5)
pH: 5 (ref 5.0–8.0)

## 2024-03-17 MED ORDER — CEFUROXIME AXETIL 250 MG PO TABS: 250.0000 mg | ORAL_TABLET | Freq: Two times a day (BID) | ORAL | Status: DC

## 2024-03-17 MED ORDER — CEFPODOXIME PROXETIL 200 MG PO TABS: 200.0000 mg | ORAL_TABLET | Freq: Two times a day (BID) | ORAL | 0 refills | Status: AC

## 2024-03-17 MED ORDER — HYDROCODONE-ACETAMINOPHEN 5-325 MG PO TABS
1.0000 | ORAL_TABLET | Freq: Once | ORAL | Status: AC
  Administered 2024-03-17: 1 via ORAL
  Filled 2024-03-17: qty 1

## 2024-03-17 MED ORDER — HYDROCODONE-ACETAMINOPHEN 5-325 MG PO TABS: 1.0000 | ORAL_TABLET | Freq: Three times a day (TID) | ORAL | 0 refills | Status: DC | PRN

## 2024-03-17 NOTE — ED Provider Notes (Signed)
 [facility] Provider Note    Event Date/Time   First MD Initiated Contact with Patient 03/17/24 1610     (approximate)   History   No chief complaint on file.   HPI  Patrick Hardin is a 83 y.o. male with PMH of hypertension, depression, diverticulosis and PUD presents for evaluation of suprapubic pain.  She was seen in this emergency department on 7/11 and had a Foley catheter placed for urinary retention.  Yesterday he went to the emergency department through the [place] and had the catheter removed.  He states that he was able to urinate after it was removed but is now having difficulty urinating and increasing suprapubic pain.  Patient describes often feeling like he needs to pee.     Physical Exam   Triage Vital Signs: ED Triage Vitals  Encounter Vitals Group     BP 03/17/24 1431 122/71     Girls Systolic BP Percentile --      Girls Diastolic BP Percentile --      Boys Systolic BP Percentile --      Boys Diastolic BP Percentile --      Pulse Rate 03/17/24 1431 88     Resp 03/17/24 1431 16     Temp 03/17/24 1431 98.3 F (36.8 C)     Temp Source 03/17/24 1431 Oral     SpO2 03/17/24 1431 94 %     Weight 03/17/24 1425 199 lb 15.3 oz (90.7 kg)     Height --      Head Circumference --      Peak Flow --      Pain Score 03/17/24 1432 10     Pain Loc --      Pain Education --      Exclude from Growth Chart --     Most recent vital signs: Vitals:   03/17/24 1431  BP: 122/71  Pulse: 88  Resp: 16  Temp: 98.3 F (36.8 C)  SpO2: 94%   General: Awake, no distress CV:  Good peripheral perfusion.  Resp:  Normal effort.  Abd:  No distention. TTP suprapubically. Other:     ED Results / Procedures / Treatments   Labs (all labs ordered are listed, but only abnormal results are displayed) Labs Reviewed  URINALYSIS, ROUTINE W REFLEX MICROSCOPIC - Abnormal; Notable for the following components:      Result Value   Color, Urine YELLOW (*)     APPearance CLOUDY (*)    Protein, ur 30 (*)    Leukocytes,Ua LARGE (*)    Bacteria, UA MANY (*)    All other components within normal limits  BASIC METABOLIC PANEL WITH GFR - Abnormal; Notable for the following components:   Glucose, Bld 121 (*)    Creatinine, Ser 1.37 (*)    GFR, Estimated 52 (*)    All other components within normal limits  CBC WITH DIFFERENTIAL/PLATELET - Abnormal; Notable for the following components:   Neutro Abs 7.9 (*)    All other components within normal limits     PROCEDURES:  Critical Care performed: No  Procedures   MEDICATIONS ORDERED IN ED: Medications  cefUROXime  (CEFTIN ) tablet 250 mg (has no administration in time range)  HYDROcodone -acetaminophen  (NORCO/VICODIN) 5-325 MG per tablet 1 tablet (1 tablet Oral Given 03/17/24 1806)     IMPRESSION / MDM / ASSESSMENT AND PLAN / ED COURSE  I reviewed the triage vital signs and the nursing notes.  83 year old male presents for evaluation of urinary retention with pelvic pain. VSS and patient very uncomfortable on exam.   Differential diagnosis includes, but is not limited to, UTI, pyelonephritis, nephrolithiasis, BPH, acute urinary retention.  Patient's presentation is most consistent with acute complicated illness / injury requiring diagnostic workup.  Will obtain UA and some lab work to check kidney function.   Clinical Course as of 03/17/24 1902  Fri Mar 17, 2024  1705 Post void residual was 274 mL. [LD]  1705 Urinalysis, Routine w reflex microscopic -Urine, Clean Catch(!) Presence of protein, leukocytes, greater than 50 WBCs and many bacteria, consistent with a UTI.  Likely due to the catheter. [LD]  1705 Will also obtain CBC and BMP to evaluate for leukocytosis and check kidney function. [LD]  1747 Recommended that we place the catheter again as patient has urinary retention.  [LD]  1826 CBC with Differential(!) Unremarkable, no leukocytosis. [LD]  1843 Basic  metabolic panel(!) Mildly elevated creatinine likely due to the urinary retention. Otherwise unremarkable. [LD]  1857 Patient is stable for outpatient management. Advised him to follow up with urology.  Both the patient and his brother were provided with contact information for urology.  Attached information for the discharge paperwork about catheter care.   [LD]  1901 Care signed out to oncoming provider who will ensure patient is discharged in stable condition. [LD]    Clinical Course User Index [LD] Cleaster Tinnie LABOR, PA-C     FINAL CLINICAL IMPRESSION(S) / ED DIAGNOSES   Final diagnoses:  Urinary tract infection without hematuria, site unspecified  Urinary retention     Rx / DC Orders   ED Discharge Orders          Ordered    cefpodoxime  (VANTIN ) 200 MG tablet  2 times daily        03/17/24 1856             Note:  This document was prepared using Dragon voice recognition software and may include unintentional dictation errors.   Cleaster Tinnie LABOR, PA-C 03/17/24 1900    Clarine Ozell LABOR, MD 03/18/24 1108

## 2024-03-17 NOTE — ED Notes (Signed)
 See triage note  Presents with some pelvic pain  States he had a foley cath removed yesterday  Was able to void  initially  Then developed some suprapubic pain and feels like not emptying his bladder Afebrile

## 2024-03-17 NOTE — ED Triage Notes (Signed)
 Yesterday foley was removed.  After returning home started having suprapubic pain.  Pain persists  States unable to completely empty bladder.  Patient to BR from Triage.

## 2024-03-17 NOTE — Discharge Instructions (Addendum)
 The urinalysis showed that you have a urinary tract infection.  Please take the antibiotics as prescribed.  He will need to follow-up with urology.  [facility] or you can schedule an appointment with the provider I have attached to this discharge paperwork.  They will determine how long the catheter will need to be in place.  Please return to the emergency department for any worsening symptoms.

## 2024-03-26 ENCOUNTER — Emergency Department
Admission: EM | Admit: 2024-03-26 | Discharge: 2024-03-26 | Disposition: A | Discharge status: Home / Self Care | Attending: Emergency Medicine | Admitting: Emergency Medicine

## 2024-03-26 ENCOUNTER — Other Ambulatory Visit: Payer: Self-pay

## 2024-03-26 DIAGNOSIS — I10 Essential (primary) hypertension: Secondary | ICD-10-CM | POA: Diagnosis not present

## 2024-03-26 DIAGNOSIS — R338 Other retention of urine: Secondary | ICD-10-CM | POA: Diagnosis not present

## 2024-03-26 DIAGNOSIS — R1030 Lower abdominal pain, unspecified: Secondary | ICD-10-CM | POA: Diagnosis present

## 2024-03-26 LAB — CBC WITH DIFFERENTIAL/PLATELET
Abs Immature Granulocytes: 0.03 K/uL (ref 0.00–0.07)
Basophils Absolute: 0.1 K/uL (ref 0.0–0.1)
Basophils Relative: 1 %
Eosinophils Absolute: 0 K/uL (ref 0.0–0.5)
Eosinophils Relative: 0 %
HCT: 43.4 % (ref 39.0–52.0)
Hemoglobin: 14.6 g/dL (ref 13.0–17.0)
Immature Granulocytes: 0 %
Lymphocytes Relative: 20 %
Lymphs Abs: 1.7 K/uL (ref 0.7–4.0)
MCH: 31.5 pg (ref 26.0–34.0)
MCHC: 33.6 g/dL (ref 30.0–36.0)
MCV: 93.7 fL (ref 80.0–100.0)
Monocytes Absolute: 0.9 K/uL (ref 0.1–1.0)
Monocytes Relative: 10 %
Neutro Abs: 6 K/uL (ref 1.7–7.7)
Neutrophils Relative %: 69 %
Platelets: 315 K/uL (ref 150–400)
RBC: 4.63 MIL/uL (ref 4.22–5.81)
RDW: 12.8 % (ref 11.5–15.5)
WBC: 8.7 K/uL (ref 4.0–10.5)
nRBC: 0 % (ref 0.0–0.2)

## 2024-03-26 LAB — URINALYSIS, W/ REFLEX TO CULTURE (INFECTION SUSPECTED)
Bilirubin Urine: NEGATIVE
Glucose, UA: NEGATIVE mg/dL
Hgb urine dipstick: NEGATIVE
Ketones, ur: NEGATIVE mg/dL
Leukocytes,Ua: NEGATIVE
Nitrite: NEGATIVE
Protein, ur: NEGATIVE mg/dL
Specific Gravity, Urine: 1.025 (ref 1.005–1.030)
Squamous Epithelial / HPF: 0 /HPF (ref 0–5)
pH: 5 (ref 5.0–8.0)

## 2024-03-26 LAB — BASIC METABOLIC PANEL WITH GFR
Anion gap: 14 (ref 5–15)
BUN: 16 mg/dL (ref 8–23)
CO2: 22 mmol/L (ref 22–32)
Calcium: 9.1 mg/dL (ref 8.9–10.3)
Chloride: 100 mmol/L (ref 98–111)
Creatinine, Ser: 1.37 mg/dL — ABNORMAL HIGH (ref 0.61–1.24)
GFR, Estimated: 52 mL/min — ABNORMAL LOW (ref >60)
Glucose, Bld: 115 mg/dL — ABNORMAL HIGH (ref 70–99)
Potassium: 3.6 mmol/L (ref 3.5–5.1)
Sodium: 136 mmol/L (ref 135–145)

## 2024-03-26 MED ORDER — MORPHINE SULFATE (PF) 2 MG/ML IV SOLN
2.0000 mg | Freq: Once | INTRAVENOUS | Status: AC
  Administered 2024-03-26: 2 mg via INTRAVENOUS
  Filled 2024-03-26: qty 1

## 2024-03-26 MED ORDER — OXYCODONE-ACETAMINOPHEN 5-325 MG PO TABS: 1.0000 | ORAL_TABLET | Freq: Four times a day (QID) | ORAL | 0 refills | Status: AC | PRN

## 2024-03-26 NOTE — ED Notes (Signed)
 Pt bladder scanned, 365 mL found. Dr Viviann at bedside.

## 2024-03-26 NOTE — ED Provider Notes (Signed)
 [facility] Provider Note    Event Date/Time   First MD Initiated Contact with Patient 03/26/24 1457     (approximate)   History   Chief Complaint: Low abdominal pain  HPI  Patrick Hardin is a 83 y.o. male with a history of hypertension, enlarged prostate who comes ED complaining of lower abdominal pain and difficulty urinating over the past few days.  Over the past 3 weeks patient has had recurrent issues with urinary retention requiring Foley catheter.  Recently was hospitalized at the [facility] in [place] for UTI with delirium, resolved with antibiotic treatment, was discharged home to complete a course of amoxicillin which finishes today.        Past Medical History:  Diagnosis Date   Depression    Diverticulosis    Hypertension    PUD (peptic ulcer disease)     Current Outpatient Rx   Order #: [id number] Class: Normal   Order #: [id number] Class: Print   Order #: [id number] Class: Normal   Order #: [id number] Class: Historical Med   Order #: [id number] Class: Historical Med   Order #: [id number] Class: Print   Order #: [id number] Class: Print   Order #: [id number] Class: Historical Med   Order #: [id number] Class: Historical Med    Past Surgical History:  Procedure Laterality Date   CARDIAC CATHETERIZATION  2006   Stenting at [facility]   CATARACT EXTRACTION W/ INTRAOCULAR LENS IMPLANT     CORONARY ANGIOPLASTY WITH STENT PLACEMENT  2006   HYDROCELE EXCISION / REPAIR  2014   repair. [facility]   TONSILLECTOMY AND ADENOIDECTOMY  1950    Physical Exam   Triage Vital Signs: ED Triage Vitals  Encounter Vitals Group     BP 03/26/24 1425 (!) 140/101     Girls Systolic BP Percentile --      Girls Diastolic BP Percentile --      Boys Systolic BP Percentile --      Boys Diastolic BP Percentile --      Pulse Rate 03/26/24 1430 (!) 115     Resp 03/26/24 1430 20     Temp 03/26/24 1425 97.9 F (36.6 C)     Temp Source 03/26/24 1425 Oral     SpO2 03/26/24 1430  97 %     Weight 03/26/24 1425 178 lb 5.6 oz (80.9 kg)     Height 03/26/24 1425 5' 11 (1.803 m)     Head Circumference --      Peak Flow --      Pain Score 03/26/24 1424 10     Pain Loc --      Pain Education --      Exclude from Growth Chart --     Most recent vital signs: Vitals:   03/26/24 1436 03/26/24 1720  BP: (!) 140/101 (!) 171/105  Pulse:  93  Resp:  16  Temp: 97.9 F (36.6 C)   SpO2: 97% 100%    General: Awake, no distress.  CV:  Good peripheral perfusion.  Tachycardia heart rate 110 Resp:  Normal effort.  Abd:  No distention.  Palpable distended bladder which is tender to palpation.  No hernias.  Bedside bladder scan greater than 300 Other:  No lower extremity edema   ED Results / Procedures / Treatments   Labs (all labs ordered are listed, but only abnormal results are displayed) Labs Reviewed  BASIC METABOLIC PANEL WITH GFR - Abnormal; Notable for the following components:      Result Value  Glucose, Bld 115 (*)    Creatinine, Ser 1.37 (*)    GFR, Estimated 52 (*)    All other components within normal limits  URINALYSIS, W/ REFLEX TO CULTURE (INFECTION SUSPECTED) - Abnormal; Notable for the following components:   Color, Urine YELLOW (*)    APPearance CLEAR (*)    Bacteria, UA RARE (*)    All other components within normal limits  CBC WITH DIFFERENTIAL/PLATELET     EKG    RADIOLOGY    PROCEDURES:  Procedures   MEDICATIONS ORDERED IN ED: Medications  morphine  (PF) 2 MG/ML injection 2 mg (2 mg Intravenous Given 03/26/24 1546)     IMPRESSION / MDM / ASSESSMENT AND PLAN / ED COURSE  I reviewed the triage vital signs and the nursing notes.  DDx: AKI, electrolyte derangement, urinary retention, cystitis  Patient's presentation is most consistent with acute presentation with potential threat to life or bodily function.  Patient presents with lower abdominal pain, clinically apparent urinary retention and bladder distention.  Will insert  Foley, check labs   Clinical Course as of 03/26/24 1738  Sun Mar 26, 2024  1716 Labs unremarkable. 500 ml UOP with foley. Feeling better and stable for DC.  [PS]    Clinical Course User Index [PS] Viviann Pastor, MD     FINAL CLINICAL IMPRESSION(S) / ED DIAGNOSES   Final diagnoses:  Acute urinary retention     Rx / DC Orders   ED Discharge Orders          Ordered    oxyCODONE -acetaminophen  (PERCOCET) 5-325 MG tablet  Every 6 hours PRN        03/26/24 1736             Note:  This document was prepared using Dragon voice recognition software and may include unintentional dictation errors.   Viviann Pastor, MD 03/26/24 [phone]

## 2024-03-26 NOTE — ED Notes (Signed)
 Pt asking for leg bag, leg bag placed. Cleaning instructions provided to pt and brother by this RN

## 2024-03-26 NOTE — ED Triage Notes (Addendum)
 Pt come sin via pov with complaints of penis pain. Pt was diagnosed with a penile infection. Pt was given medication to treat the infection and to help with pain. Pt was only given 5 pain meds, and states that the pain is very unbearable. Pt is currently taking antibiotics, and took both doses today.  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 Pt complains of pain 10/10.

## 2024-03-26 NOTE — ED Notes (Signed)
 Pt using restroom down the hall, pt guided back to room and informed Dr would see shortly. Pt complaining of 10/10 pain.

## 2024-04-18 ENCOUNTER — Ambulatory Visit: Admitting: Urology
